# Patient Record
Sex: Male | Born: 1970 | Race: White | Hispanic: No | Marital: Married | Smoking: Never smoker
Health system: Southern US, Community
[De-identification: ages and names within clinical notes are randomized; demographics above are authoritative.]

## PROBLEM LIST (undated history)

## (undated) DIAGNOSIS — I10 Essential (primary) hypertension: Secondary | ICD-10-CM

## (undated) DIAGNOSIS — E559 Vitamin D deficiency, unspecified: Secondary | ICD-10-CM

## (undated) HISTORY — PX: APPENDECTOMY: SHX54

## (undated) HISTORY — PX: HERNIA REPAIR: SHX51

## (undated) HISTORY — DX: Vitamin D deficiency, unspecified: E55.9

---

## 2019-03-14 ENCOUNTER — Emergency Department (HOSPITAL_COMMUNITY): Payer: Medicaid Other

## 2019-03-14 ENCOUNTER — Encounter (HOSPITAL_COMMUNITY): Payer: Self-pay

## 2019-03-14 ENCOUNTER — Emergency Department (HOSPITAL_COMMUNITY)
Admission: EM | Admit: 2019-03-14 | Discharge: 2019-03-14 | Disposition: A | Discharge status: Home / Self Care | Payer: Medicaid Other | Attending: Emergency Medicine | Admitting: Emergency Medicine

## 2019-03-14 ENCOUNTER — Other Ambulatory Visit: Payer: Self-pay

## 2019-03-14 DIAGNOSIS — I10 Essential (primary) hypertension: Secondary | ICD-10-CM | POA: Diagnosis not present

## 2019-03-14 DIAGNOSIS — R42 Dizziness and giddiness: Secondary | ICD-10-CM | POA: Insufficient documentation

## 2019-03-14 HISTORY — DX: Essential (primary) hypertension: I10

## 2019-03-14 LAB — CBC WITH DIFFERENTIAL/PLATELET
Abs Immature Granulocytes: 0.02 10*3/uL (ref 0.00–0.07)
Basophils Absolute: 0 10*3/uL (ref 0.0–0.1)
Basophils Relative: 1 %
Eosinophils Absolute: 0.1 10*3/uL (ref 0.0–0.5)
Eosinophils Relative: 2 %
HCT: 46.2 % (ref 39.0–52.0)
Hemoglobin: 15.3 g/dL (ref 13.0–17.0)
Immature Granulocytes: 0 %
Lymphocytes Relative: 21 %
Lymphs Abs: 1 10*3/uL (ref 0.7–4.0)
MCH: 28.9 pg (ref 26.0–34.0)
MCHC: 33.1 g/dL (ref 30.0–36.0)
MCV: 87.2 fL (ref 80.0–100.0)
Monocytes Absolute: 0.4 10*3/uL (ref 0.1–1.0)
Monocytes Relative: 8 %
Neutro Abs: 3.2 10*3/uL (ref 1.7–7.7)
Neutrophils Relative %: 68 %
Platelets: 176 10*3/uL (ref 150–400)
RBC: 5.3 MIL/uL (ref 4.22–5.81)
RDW: 12.4 % (ref 11.5–15.5)
WBC: 4.7 10*3/uL (ref 4.0–10.5)
nRBC: 0 % (ref 0.0–0.2)

## 2019-03-14 LAB — BASIC METABOLIC PANEL
Anion gap: 9 (ref 5–15)
BUN: 12 mg/dL (ref 6–20)
CO2: 27 mmol/L (ref 22–32)
Calcium: 8.9 mg/dL (ref 8.9–10.3)
Chloride: 101 mmol/L (ref 98–111)
Creatinine, Ser: 0.79 mg/dL (ref 0.61–1.24)
GFR calc Af Amer: >60 mL/min (ref >60)
GFR calc non Af Amer: >60 mL/min (ref >60)
Glucose, Bld: 112 mg/dL — ABNORMAL HIGH (ref 70–99)
Potassium: 4.4 mmol/L (ref 3.5–5.1)
Sodium: 137 mmol/L (ref 135–145)

## 2019-03-14 LAB — URINALYSIS, ROUTINE W REFLEX MICROSCOPIC
Bilirubin Urine: NEGATIVE
Glucose, UA: NEGATIVE mg/dL
Hgb urine dipstick: NEGATIVE
Ketones, ur: NEGATIVE mg/dL
Leukocytes,Ua: NEGATIVE
Nitrite: NEGATIVE
Protein, ur: NEGATIVE mg/dL
Specific Gravity, Urine: 1.021 (ref 1.005–1.030)
pH: 7 (ref 5.0–8.0)

## 2019-03-14 LAB — TROPONIN I (HIGH SENSITIVITY)
Troponin I (High Sensitivity): <2 ng/L (ref <18)
Troponin I (High Sensitivity): <2 ng/L (ref <18)

## 2019-03-14 MED ORDER — MECLIZINE HCL 12.5 MG PO TABS
25.0000 mg | ORAL_TABLET | Freq: Once | ORAL | Status: AC
  Administered 2019-03-14: 25 mg via ORAL
  Filled 2019-03-14: qty 2

## 2019-03-14 MED ORDER — LISINOPRIL 10 MG PO TABS
10.0000 mg | ORAL_TABLET | Freq: Every day | ORAL | Status: DC
  Filled 2019-03-14: qty 1

## 2019-03-14 MED ORDER — LISINOPRIL 10 MG PO TABS: 10.0000 mg | ORAL_TABLET | Freq: Every day | ORAL | 0 refills | Status: DC

## 2019-03-14 MED ORDER — SODIUM CHLORIDE 0.9 % IV BOLUS
1000.0000 mL | Freq: Once | INTRAVENOUS | Status: AC
  Administered 2019-03-14: 1000 mL via INTRAVENOUS

## 2019-03-14 MED ORDER — MECLIZINE HCL 25 MG PO TABS: 25.0000 mg | ORAL_TABLET | Freq: Three times a day (TID) | ORAL | 0 refills | Status: DC | PRN

## 2019-03-14 NOTE — Discharge Instructions (Signed)
Your blood work and CT of your head today were normal.  I have written a prescription for you to start back on your blood pressure medications.  I have listed several local primary providers for you to contact to establish primary care.  Return to the emergency department for any worsening symptoms.

## 2019-03-14 NOTE — ED Triage Notes (Signed)
Pt reports dizziness and feeling off balance since 9:30 yesterday morning.  Denies any weakness or numbness on either side, denies difficulty speaking or swallowing, denies visual changes or headache.  Reports nauseated this morning, no vomiting.

## 2019-03-14 NOTE — ED Provider Notes (Signed)
[facility] Provider Note   CSN: [id number] Arrival date & time: 03/14/19  9242     History Chief Complaint  Patient presents with  . Dizziness    Paul [name] is a 49 y.o. male.  HPI      Paul [name] is a 49 y.o. male who presents to the Emergency Department complaining of persistent dizziness.  He noticed mild dizziness with position change that began yesterday morning.  This morning, he woke with same symptoms.  Describes feeling "off balance" and symptoms worsen with movement.  No alleviating factors.  Symptoms are not associated with chest pain, shortness of breath,  headache, visual changes, nausea or vomiting, pain numbness or weakness of the extremities or face.  He reports a history of hypertension, but has been out of his medication for some time since moving here from [place].  He does not currently have a PCP.    Past Medical History:  Diagnosis Date  . Hypertension     There are no problems to display for this patient.   Past Surgical History:  Procedure Laterality Date  . APPENDECTOMY    . HERNIA REPAIR         No family history on file.  Social History   Tobacco Use  . Smoking status: Never Smoker  . Smokeless tobacco: Never Used  Substance Use Topics  . Alcohol use: Yes    Comment: occ  . Drug use: Never    Home Medications Prior to Admission medications   Not on File    Allergies    Patient has no known allergies.  Review of Systems   Review of Systems  Constitutional: Negative for appetite change, chills and fever.  HENT: Negative for facial swelling and trouble swallowing.   Eyes: Negative for photophobia, pain and visual disturbance.  Respiratory: Negative for chest tightness and shortness of breath.   Cardiovascular: Negative for chest pain and leg swelling.  Gastrointestinal: Negative for abdominal pain, nausea and vomiting.  Genitourinary: Negative for decreased urine volume.  Musculoskeletal:  Negative for myalgias, neck pain and neck stiffness.  Skin: Negative for rash and wound.  Neurological: Positive for dizziness. Negative for seizures, syncope, facial asymmetry, speech difficulty, weakness, numbness and headaches.  Psychiatric/Behavioral: Negative for confusion and decreased concentration.    Physical Exam Updated Vital Signs BP (!) 156/100   Pulse 61   Temp 97.7 F (36.5 C) (Oral)   Resp 14   Ht 5\' 10"  (1.778 m)   Wt 124.7 kg   SpO2 98%   BMI 39.46 kg/m   Physical Exam Vitals and nursing note reviewed.  Constitutional:      General: He is not in acute distress.    Appearance: Normal appearance.  HENT:     Head: Atraumatic.     Right Ear: Tympanic membrane and ear canal normal.     Left Ear: Tympanic membrane and ear canal normal.     Mouth/Throat:     Mouth: Mucous membranes are moist.     Pharynx: Oropharynx is clear.  Eyes:     Extraocular Movements: Extraocular movements intact.     Conjunctiva/sclera: Conjunctivae normal.     Pupils: Pupils are equal, round, and reactive to light.  Cardiovascular:     Rate and Rhythm: Regular rhythm.     Pulses: Normal pulses.  Pulmonary:     Effort: Pulmonary effort is normal.     Breath sounds: Normal breath sounds.  Chest:     Chest wall:  No tenderness.  Abdominal:     General: There is no distension.     Palpations: Abdomen is soft.     Tenderness: There is no abdominal tenderness.  Musculoskeletal:        General: Normal range of motion.     Cervical back: Normal range of motion.     Right lower leg: No edema.     Left lower leg: No edema.  Lymphadenopathy:     Cervical: No cervical adenopathy.  Skin:    General: Skin is warm.     Capillary Refill: Capillary refill takes less than 2 seconds.     Findings: No erythema or rash.  Neurological:     General: No focal deficit present.     Mental Status: He is alert.     Sensory: Sensation is intact. No sensory deficit.     Motor: Motor function is  intact. No weakness.     Coordination: Coordination is intact. Romberg sign negative. Coordination normal.     Comments: CN II-XII intact.  Speech clear.  nml finger-nose testing.  No facial weakness.   Psychiatric:        Mood and Affect: Mood normal.     ED Results / Procedures / Treatments   Labs (all labs ordered are listed, but only abnormal results are displayed) Labs Reviewed  BASIC METABOLIC PANEL - Abnormal; Notable for the following components:      Result Value   Glucose, Bld 112 (*)    All other components within normal limits  CBC WITH DIFFERENTIAL/PLATELET  URINALYSIS, ROUTINE W REFLEX MICROSCOPIC  TROPONIN I (HIGH SENSITIVITY)  TROPONIN I (HIGH SENSITIVITY)    EKG EKG Interpretation  Date/Time:  Thursday March 14 2019 09:56:11 EST Ventricular Rate:  64 PR Interval:    QRS Duration: 120 QT Interval:  410 QTC Calculation: 423 R Axis:   28 Text Interpretation: Sinus rhythm Nonspecific intraventricular conduction delay Anteroseptal infarct, age indeterminate Lateral leads are also involved Confirmed by Donnetta Hutching ([id number]) on 03/14/2019 1:41:24 PM   Radiology CT Head Wo Contrast  Result Date: 03/14/2019 CLINICAL DATA:  Dizziness. Ataxia. Stroke suspected. EXAM: CT HEAD WITHOUT CONTRAST TECHNIQUE: Contiguous axial images were obtained from the base of the skull through the vertex without intravenous contrast. COMPARISON:  None. FINDINGS: Brain: No mass lesion, hemorrhage, hydrocephalus, acute infarct, intra-axial, or extra-axial fluid collection. Vascular: No hyperdense vessel or unexpected calcification. Skull: Normal. Sinuses/Orbits: Normal imaged portions of the orbits and globes. Ethmoid air cell mucosal thickening. Clear mastoid air cells. Other: None. IMPRESSION: 1. No acute intracranial abnormality. 2. Minimal sinus disease. Electronically Signed   By: Jeronimo Greaves M.D.   On: 03/14/2019 11:44   DG Chest Portable 1 View  Result Date: 03/14/2019 CLINICAL DATA:   Dizziness EXAM: PORTABLE CHEST 1 VIEW COMPARISON:  None. FINDINGS: The heart size is upper limits of normal for technique. No focal airspace consolidation, pleural effusion, or pneumothorax. The visualized skeletal structures are unremarkable. IMPRESSION: No active disease. Electronically Signed   By: Duanne Guess D.O.   On: 03/14/2019 10:41    Procedures Procedures (including critical care time)  Medications Ordered in ED Medications  sodium chloride 0.9 % bolus 1,000 mL (0 mLs Intravenous Stopped 03/14/19 1250)  meclizine (ANTIVERT) tablet 25 mg (25 mg Oral Given 03/14/19 1336)    ED Course  I have reviewed the triage vital signs and the nursing notes.  Pertinent labs & imaging results that were available during my care of the patient were  reviewed by me and considered in my medical decision making (see chart for details).    MDM Rules/Calculators/A&P                      Pt with dizziness associated with position change.  No associated symptoms. Hx of HTN and has been w/o medication for more than one year  Will obtain labs, CT head and EKG.  Pt also seen by Dr. Adriana Simas and care plan discussed.    Orthostatic vitals:  Sitting:  153/93  HR 69  Standing:  166/94  HR  83  On recheck, pt sleeping, easily aroused.  Feeling better.  BP now 120/85 while I was in the room.  Work up today is reassuring.  Pt ambulates with a steady gait.  No focal neuro deficits here.  I will refill his anti-hypertensive medication and give him referral info for local primary care.  Return precautions discussed.    Final Clinical Impression(s) / ED Diagnoses Final diagnoses:  Dizziness  Hypertension, unspecified type    Rx / DC Orders ED Discharge Orders    None       Pauline Aus, PA-C 03/15/19 0914    Donnetta Hutching, MD 03/15/19 [phone]

## 2019-05-06 ENCOUNTER — Encounter (INDEPENDENT_AMBULATORY_CARE_PROVIDER_SITE_OTHER): Payer: Self-pay | Admitting: Internal Medicine

## 2019-05-06 ENCOUNTER — Other Ambulatory Visit: Payer: Self-pay

## 2019-05-06 ENCOUNTER — Ambulatory Visit (INDEPENDENT_AMBULATORY_CARE_PROVIDER_SITE_OTHER): Payer: Medicaid Other | Admitting: Internal Medicine

## 2019-05-06 VITALS — BP 150/75 | HR 80 | Temp 97.7°F | Ht 70.0 in | Wt 289.8 lb

## 2019-05-06 DIAGNOSIS — R5383 Other fatigue: Secondary | ICD-10-CM | POA: Diagnosis not present

## 2019-05-06 DIAGNOSIS — I10 Essential (primary) hypertension: Secondary | ICD-10-CM

## 2019-05-06 DIAGNOSIS — Z125 Encounter for screening for malignant neoplasm of prostate: Secondary | ICD-10-CM | POA: Diagnosis not present

## 2019-05-06 DIAGNOSIS — E559 Vitamin D deficiency, unspecified: Secondary | ICD-10-CM | POA: Diagnosis not present

## 2019-05-06 DIAGNOSIS — R5381 Other malaise: Secondary | ICD-10-CM

## 2019-05-06 MED ORDER — LISINOPRIL 10 MG PO TABS: 10.0000 mg | ORAL_TABLET | Freq: Every day | ORAL | 3 refills | Status: DC

## 2019-05-06 NOTE — Progress Notes (Signed)
Metrics: Intervention Frequency ACO  Documented Smoking Status Yearly  Screened one or more times in 24 months  Cessation Counseling or  Active cessation medication Past 24 months  Past 24 months   Guideline developer: UpToDate (See UpToDate for funding source) Date Released: 2014       Wellness Office Visit  Subjective:  Patient ID: Paul [name], male    DOB: 01/15/1970  Age: 49 y.o. MRN: [id number]  CC: This 49 year old man comes to our practice as a new patient to be established.    HPI  He was seen in the emergency room in March of this year when he presented with dizziness and he was found to be significantly hypertensive.  He has known to have hypertension but it seems he has not been very compliant with medications in the past.  Once he started taking the medications, he did feel better. Presently he has run out of medications again.  He describes intermittent fatigue. He tells me that since he has moved down to [place] from [place], he has gained about 60 pounds. Past Medical History:  Diagnosis Date  . Hypertension       Family History  Problem Relation Age of Onset  . Healthy Mother   . Cancer Father   . Lung cancer Father     Social History   Social History Narrative   Married for 8 months,second.Lives with wife and daughter.Moved from [place] in 2018.   Social History   Tobacco Use  . Smoking status: Never Smoker  . Smokeless tobacco: Never Used  Substance Use Topics  . Alcohol use: Yes    Alcohol/week: 18.0 standard drinks    Types: 18 Cans of beer per week    Current Meds  Medication Sig  . lisinopril (ZESTRIL) 10 MG tablet Take 1 tablet (10 mg total) by mouth daily.  . [DISCONTINUED] lisinopril (ZESTRIL) 10 MG tablet Take 1 tablet (10 mg total) by mouth daily.      Objective:   Today's Vitals: BP (!) 150/75 (BP Location: Left Arm, Patient Position: Sitting, Cuff Size: Normal)   Pulse 80   Temp 97.7 F (36.5 C) (Temporal)   Ht 5'  10" (1.778 m)   Wt 289 lb 12.8 oz (131.5 kg)   SpO2 92%   BMI 41.58 kg/m  Vitals with BMI 05/06/2019 03/14/2019 03/14/2019  Height 5\' 10"  - -  Weight 289 lbs 13 oz - -  BMI 60.10 - -  Systolic 932 355 732  Diastolic 75 88 202  Pulse 80 68 61     Physical Exam  He looks systemically well.  Blood pressure not well controlled systolically.  Morbidly obese.  Alert and orientated without any focal neurological signs.     Assessment   1. Essential hypertension, benign   2. Morbid obesity ([place])   3. Malaise and fatigue   4. Vitamin D deficiency disease   5. Special screening for malignant neoplasm of prostate       Tests ordered Orders Placed This Encounter  Procedures  . COMPLETE METABOLIC PANEL WITH GFR  . Hemoglobin A1c  . Lipid panel  . PSA  . T3, free  . T4  . TSH  . VITAMIN D 25 Hydroxy (Vit-D Deficiency, Fractures)     Plan: 1. Blood work is ordered. 2. I have refilled his lisinopril. 3. Further recommendations will depend on blood results and I will see him in a few weeks to discuss all his results and further recommendations.  Meds ordered this encounter  Medications  . lisinopril (ZESTRIL) 10 MG tablet    Sig: Take 1 tablet (10 mg total) by mouth daily.    Dispense:  30 tablet    Refill:  3    Leylah Tarnow Normajean Glasgow, MD

## 2019-05-07 LAB — COMPLETE METABOLIC PANEL WITH GFR
AG Ratio: 1.7 (calc) (ref 1.0–2.5)
ALT: 34 U/L (ref 9–46)
AST: 19 U/L (ref 10–35)
Albumin: 4.7 g/dL (ref 3.6–5.1)
Alkaline phosphatase (APISO): 96 U/L (ref 35–144)
BUN: 17 mg/dL (ref 7–25)
CO2: 29 mmol/L (ref 20–32)
Calcium: 9.6 mg/dL (ref 8.6–10.3)
Chloride: 101 mmol/L (ref 98–110)
Creat: 0.92 mg/dL (ref 0.70–1.33)
GFR, Est African American: 112 mL/min/{1.73_m2} (ref >=60)
GFR, Est Non African American: 97 mL/min/{1.73_m2} (ref >=60)
Globulin: 2.8 g/dL (calc) (ref 1.9–3.7)
Glucose, Bld: 83 mg/dL (ref 65–99)
Potassium: 4.3 mmol/L (ref 3.5–5.3)
Sodium: 139 mmol/L (ref 135–146)
Total Bilirubin: 0.6 mg/dL (ref 0.2–1.2)
Total Protein: 7.5 g/dL (ref 6.1–8.1)

## 2019-05-07 LAB — VITAMIN D 25 HYDROXY (VIT D DEFICIENCY, FRACTURES): Vit D, 25-Hydroxy: 15 ng/mL — ABNORMAL LOW (ref 30–100)

## 2019-05-07 LAB — HEMOGLOBIN A1C
Hgb A1c MFr Bld: 5.2 % of total Hgb (ref <5.7)
Mean Plasma Glucose: 103 (calc)
eAG (mmol/L): 5.7 (calc)

## 2019-05-07 LAB — PSA: PSA: 1.2 ng/mL (ref <=4.0)

## 2019-05-07 LAB — TSH: TSH: 1.53 mIU/L (ref 0.40–4.50)

## 2019-05-07 LAB — LIPID PANEL
Cholesterol: 187 mg/dL (ref <200)
HDL: 38 mg/dL — ABNORMAL LOW (ref >=40)
LDL Cholesterol (Calc): 111 mg/dL (calc) — ABNORMAL HIGH
Non-HDL Cholesterol (Calc): 149 mg/dL (calc) — ABNORMAL HIGH (ref <130)
Total CHOL/HDL Ratio: 4.9 (calc) (ref <5.0)
Triglycerides: 263 mg/dL — ABNORMAL HIGH (ref <150)

## 2019-05-07 LAB — T4: T4, Total: 7.6 ug/dL (ref 4.9–10.5)

## 2019-05-07 LAB — T3, FREE: T3, Free: 3.4 pg/mL (ref 2.3–4.2)

## 2019-06-06 ENCOUNTER — Ambulatory Visit (INDEPENDENT_AMBULATORY_CARE_PROVIDER_SITE_OTHER): Payer: Medicaid Other | Admitting: Internal Medicine

## 2019-07-31 ENCOUNTER — Encounter (INDEPENDENT_AMBULATORY_CARE_PROVIDER_SITE_OTHER): Payer: Self-pay | Admitting: Nurse Practitioner

## 2019-07-31 DIAGNOSIS — I10 Essential (primary) hypertension: Secondary | ICD-10-CM | POA: Insufficient documentation

## 2019-07-31 DIAGNOSIS — E559 Vitamin D deficiency, unspecified: Secondary | ICD-10-CM | POA: Insufficient documentation

## 2019-07-31 DIAGNOSIS — E785 Hyperlipidemia, unspecified: Secondary | ICD-10-CM | POA: Insufficient documentation

## 2019-08-01 ENCOUNTER — Telehealth (INDEPENDENT_AMBULATORY_CARE_PROVIDER_SITE_OTHER): Payer: Self-pay | Admitting: Nurse Practitioner

## 2019-08-01 ENCOUNTER — Ambulatory Visit (INDEPENDENT_AMBULATORY_CARE_PROVIDER_SITE_OTHER): Payer: Medicaid Other | Admitting: Nurse Practitioner

## 2019-08-01 ENCOUNTER — Encounter (INDEPENDENT_AMBULATORY_CARE_PROVIDER_SITE_OTHER): Payer: Self-pay | Admitting: Nurse Practitioner

## 2019-08-01 ENCOUNTER — Other Ambulatory Visit: Payer: Self-pay

## 2019-08-01 VITALS — BP 150/60 | HR 85 | Resp 16 | Ht 71.0 in | Wt 287.4 lb

## 2019-08-01 DIAGNOSIS — E785 Hyperlipidemia, unspecified: Secondary | ICD-10-CM

## 2019-08-01 DIAGNOSIS — I10 Essential (primary) hypertension: Secondary | ICD-10-CM | POA: Diagnosis not present

## 2019-08-01 DIAGNOSIS — E559 Vitamin D deficiency, unspecified: Secondary | ICD-10-CM | POA: Diagnosis not present

## 2019-08-01 DIAGNOSIS — N63 Unspecified lump in unspecified breast: Secondary | ICD-10-CM | POA: Insufficient documentation

## 2019-08-01 MED ORDER — LISINOPRIL-HYDROCHLOROTHIAZIDE 10-12.5 MG PO TABS: 1.0000 | ORAL_TABLET | Freq: Every day | ORAL | 3 refills | Status: DC

## 2019-08-01 NOTE — Telephone Encounter (Signed)
I ordered diagnostic mammogram and ultrasound on this patient today for further evaluation of a mass noted to his left breast. This was ordered at his office visit earlier today, please make sure that this gets scheduled. Thank you.

## 2019-08-01 NOTE — Patient Instructions (Addendum)
Start vitamin D3 - 5000Ius by mouth daily  [place] [facility] Dietary Recommendations for Weight Loss What to Avoid . Avoid added sugars o Often added sugar can be found in processed foods such as many condiments, dry cereals, cakes, cookies, chips, crisps, crackers, candies, sweetened drinks, etc.  o Read labels and AVOID/DECREASE use of foods with the following in their ingredient list: Sugar, fructose, high fructose corn syrup, sucrose, glucose, maltose, dextrose, molasses, cane sugar, brown sugar, any type of syrup, agave nectar, etc.   . Avoid snacking in between meals . Avoid foods made with flour o If you are going to eat food made with flour, choose those made with whole-grains; and, minimize your consumption as much as is tolerable . Avoid processed foods o These foods are generally stocked in the middle of the grocery store. Focus on shopping on the perimeter of the grocery.  . Avoid Meat  o We recommend following a plant-based diet at [facility]. Thus, we recommend avoiding meat as a general rule. Consider eating beans, legumes, eggs, and/or dairy products for regular protein sources o If you plan on eating meat limit to 4 ounces of meat at a time and choose lean options such as Fish, chicken, [place]. Avoid red meat intake such as pork and/or steak What to Include . Vegetables o GREEN LEAFY VEGETABLES: Kale, spinach, mustard greens, collard greens, cabbage, broccoli, etc. o OTHER: Asparagus, cauliflower, eggplant, carrots, peas, Brussel sprouts, tomatoes, bell peppers, zucchini, beets, cucumbers, etc. . Grains, seeds, and legumes o Beans: kidney beans, black eyed peas, garbanzo beans, black beans, pinto beans, etc. o Whole, unrefined grains: brown rice, barley, bulgur, oatmeal, etc. . Healthy fats  o Avoid highly processed fats such as vegetable oil o Examples of healthy fats: avocado, olives, virgin olive oil, dark chocolate (?72% Cocoa), nuts (peanuts, almonds,  walnuts, cashews, pecans, etc.) . None to Low Intake of Animal Sources of Protein o Meat sources: chicken, [place], salmon, tuna. Limit to 4 ounces of meat at one time. o Consider limiting dairy sources, but when choosing dairy focus on: PLAIN [place] yogurt, cottage cheese, high-protein milk . Fruit o Choose berries  When to Eat . Intermittent Fasting: o Choosing not to eat for a specific time period, but DO FOCUS ON HYDRATION when fasting o Multiple Techniques: - Time Restricted Eating: eat 3 meals in a day, each meal lasting no more than 60 minutes, no snacks between meals - 16-18 hour fast: fast for 16 to 18 hours up to 7 days a week. Often suggested to start with 2-3 nonconsecutive days per week.  . Remember the time you sleep is counted as fasting.  . Examples of eating schedule: Fast from 7:00pm-11:00am. Eat between 11:00am-7:00pm.  - 24-hour fast: fast for 24 hours up to every other day. Often suggested to start with 1 day per week . Remember the time you sleep is counted as fasting . Examples of eating schedule:  o Eating day: eat 2-3 meals on your eating day. If doing 2 meals, each meal should last no more than 90 minutes. If doing 3 meals, each meal should last no more than 60 minutes. Finish last meal by 7:00pm. o Fasting day: Fast until 7:00pm.  o IF YOU FEEL UNWELL FOR ANY REASON/IN ANY WAY WHEN FASTING, STOP FASTING BY EATING A NUTRITIOUS SNACK OR LIGHT MEAL o ALWAYS FOCUS ON HYDRATION DURING FASTS - Acceptable Hydration sources: water, broths, tea/coffee (black tea/coffee is best but using a small amount of  whole-fat dairy products in coffee/tea is acceptable).  - Poor Hydration Sources: anything with sugar or artificial sweeteners added to it  These recommendations have been developed for patients that are actively receiving medical care from either Dr. Anastasio Champion or Jeralyn Ruths, DNP, NP-C at [facility]. These recommendations are developed for patients with specific  medical conditions and are not meant to be distributed or used by others that are not actively receiving care from either provider listed above at [facility]. It is not appropriate to participate in the above eating plans without proper medical supervision.   Reference: Rexanne Mano. The obesity code. [place]; 2016.

## 2019-08-01 NOTE — Progress Notes (Signed)
Subjective:  Patient ID: Paul [name], male    DOB: 1970/08/15  Age: 49 y.o. MRN: [id number]  CC:  Chief Complaint  Patient presents with  . Hypertension    follow up      HPI  This patient comes in today for follow-up of hypertension.  Hypertension: He continues on lisinopril 10 mg daily. He is tolerating medication well. He tells me he will have some episodes of dizziness which she was evaluated for earlier this year in emergency department.  Lab work was collected as last office visit which did show hyperlipidemia and vitamin D deficiency.  He also mentions to me that he noticed a mass to his left breast approximately 1 week ago. He tells me he feels like it has moved, size is remained stable. He denies any nipple discharge he denies any pain.   Past Medical History:  Diagnosis Date  . Hypertension   . Vitamin D deficiency       Family History  Problem Relation Age of Onset  . Healthy Mother   . Cancer Father   . Lung cancer Father     Social History   Social History Narrative   Married for 8 months,second.Lives with wife and daughter.Moved from [place] in 2018.   Social History   Tobacco Use  . Smoking status: Never Smoker  . Smokeless tobacco: Never Used  Substance Use Topics  . Alcohol use: Yes    Alcohol/week: 18.0 standard drinks    Types: 18 Cans of beer per week     Current Meds  Medication Sig  . Cholecalciferol 1.25 MG (50000 UT) TABS Take 5,000 tablets by mouth daily.  . [DISCONTINUED] lisinopril (ZESTRIL) 10 MG tablet Take 1 tablet (10 mg total) by mouth daily.    ROS:  Review of Systems  Constitutional: Negative.   Respiratory: Negative.   Cardiovascular: Negative.   Neurological: Positive for dizziness.     Objective:   Today's Vitals: BP (!) 150/60   Pulse 85   Resp 16   Ht 5\' 11"  (1.803 m)   Wt (!) 287 lb 6.4 oz (130.4 kg)   SpO2 97%   BMI 40.08 kg/m  Vitals with BMI 08/01/2019 05/06/2019 03/14/2019  Height 5\' 11"   5\' 10"  -  Weight 287 lbs 6 oz 289 lbs 13 oz -  BMI 40.1 41.58 -  Systolic 150 150 05/14/2019  Diastolic 60 75 88  Pulse 85 80 68     Physical Exam Vitals reviewed.  Constitutional:      Appearance: Normal appearance.  HENT:     Head: Normocephalic and atraumatic.  Cardiovascular:     Rate and Rhythm: Normal rate and regular rhythm.  Pulmonary:     Effort: Pulmonary effort is normal.     Breath sounds: Normal breath sounds.  Chest:     Breasts: Breasts are symmetrical.        Left: Mass present. No swelling, inverted nipple, nipple discharge, skin change or tenderness.    Musculoskeletal:     Cervical back: Neck supple.  Skin:    General: Skin is warm and dry.  Neurological:     Mental Status: He is alert and oriented to person, place, and time.  Psychiatric:        Mood and Affect: Mood normal.        Behavior: Behavior normal.        Thought Content: Thought content normal.        Judgment: Judgment normal.  Assessment and Plan   1. Essential hypertension, benign   2. Breast mass in male   3. Hyperlipidemia, unspecified hyperlipidemia type   4. Vitamin D deficiency      Plan: 1. Blood pressure remains elevated. Will add hydrochlorothiazide to his medication regimen, he will return in 2 weeks for blood pressure check and repeat CMP. 2. I am going to send him for mammogram and ultrasound for further evaluation of the mass in his breast. 3. ASCVD risk score is approximately 6.5. We did discuss lifestyle and I encouraged him to follow a plant focused diet. I told him to avoid processed carbohydrates, red meat, and processed meats. I told him to focus on eating whole foods and legumes. He tells me he understands. 4. He was told to start 5000 IUs of vitamin D3 daily.   Tests ordered Orders Placed This Encounter  Procedures  . MM Digital Diagnostic Unilat L  . US BREAST COMPLETE UNI LEFT INC AXILLA      Meds ordered this encounter  Medications  .  lisinopril-hydrochlorothiazide (ZESTORETIC) 10-12.5 MG tablet    Sig: Take 1 tablet by mouth daily.    Dispense:  90 tablet    Refill:  3    Order Specific Question:   Supervising Provider    Answer:   Wilson Singer [[id number]]    Patient to follow-up in 2 weeks for blood pressure check and repeat metabolic panel.  Elenore Paddy, NP

## 2019-08-05 ENCOUNTER — Other Ambulatory Visit (HOSPITAL_COMMUNITY): Payer: Self-pay | Admitting: General Practice

## 2019-08-05 ENCOUNTER — Other Ambulatory Visit (HOSPITAL_COMMUNITY): Payer: Self-pay | Admitting: Nurse Practitioner

## 2019-08-05 DIAGNOSIS — N63 Unspecified lump in unspecified breast: Secondary | ICD-10-CM

## 2019-08-05 NOTE — Telephone Encounter (Signed)
[place] is scheduled on 08/13/19 at 3:00

## 2019-08-13 ENCOUNTER — Ambulatory Visit (HOSPITAL_COMMUNITY)
Admission: RE | Admit: 2019-08-13 | Discharge: 2019-08-13 | Disposition: A | Discharge status: Home / Self Care | Payer: Medicaid Other | Source: Ambulatory Visit | Attending: Nurse Practitioner | Admitting: Nurse Practitioner

## 2019-08-13 ENCOUNTER — Other Ambulatory Visit: Payer: Self-pay

## 2019-08-13 DIAGNOSIS — N63 Unspecified lump in unspecified breast: Secondary | ICD-10-CM

## 2019-08-13 DIAGNOSIS — R928 Other abnormal and inconclusive findings on diagnostic imaging of breast: Secondary | ICD-10-CM | POA: Diagnosis not present

## 2019-08-15 ENCOUNTER — Ambulatory Visit (INDEPENDENT_AMBULATORY_CARE_PROVIDER_SITE_OTHER): Payer: Medicaid Other | Admitting: Nurse Practitioner

## 2019-08-15 ENCOUNTER — Other Ambulatory Visit: Payer: Self-pay

## 2019-08-15 ENCOUNTER — Encounter (INDEPENDENT_AMBULATORY_CARE_PROVIDER_SITE_OTHER): Payer: Self-pay | Admitting: Nurse Practitioner

## 2019-08-15 VITALS — BP 125/80 | HR 74 | Temp 97.5°F | Ht 71.0 in | Wt 285.4 lb

## 2019-08-15 DIAGNOSIS — N62 Hypertrophy of breast: Secondary | ICD-10-CM

## 2019-08-15 DIAGNOSIS — N529 Male erectile dysfunction, unspecified: Secondary | ICD-10-CM | POA: Diagnosis not present

## 2019-08-15 DIAGNOSIS — E559 Vitamin D deficiency, unspecified: Secondary | ICD-10-CM | POA: Diagnosis not present

## 2019-08-15 DIAGNOSIS — I10 Essential (primary) hypertension: Secondary | ICD-10-CM | POA: Diagnosis not present

## 2019-08-15 NOTE — Progress Notes (Signed)
Subjective:  Patient ID: [name], male    DOB: 1970/12/24  Age: 49 y.o. MRN: [id number]  CC:  Chief Complaint  Patient presents with  . Hypertension  . Labs Only  . Follow-up      HPI  This patient comes in today for the above.  At last office visit we added hydrochlorothiazide to his medication regimen in addition to his lisinopril due to elevated high blood pressure.  He tells me has been feeling pretty well with this medication change, however he has noticed some difficulty with achieving and maintaining an erection.  He tells me he has noticed the symptoms intermittently prior to starting on antihypertensives, but the symptoms seem to be more pronounced since getting his blood pressure better controlled.  In addition, he did note a breast mass to his left breast at his last office visit.  He did undergo mammogram and no evidence of malignancy was noted.  Imaging report indicated mass is probably gynecomastia.  He continues to take his vitamin D3 supplement as recommended.  He is currently on 5000 IUs of vitamin D3 daily.  Past Medical History:  Diagnosis Date  . Hypertension   . Vitamin D deficiency       Family History  Problem Relation Age of Onset  . Healthy Mother   . Cancer Father   . Lung cancer Father     Social History   Social History Narrative   Married for 8 months,second.Lives with wife and daughter.Moved from [place] in 2018.   Social History   Tobacco Use  . Smoking status: Never Smoker  . Smokeless tobacco: Never Used  Substance Use Topics  . Alcohol use: Yes    Alcohol/week: 18.0 standard drinks    Types: 18 Cans of beer per week     Current Meds  Medication Sig  . Cholecalciferol 1.25 MG (50000 UT) TABS Take 5,000 tablets by mouth daily.  Marland Kitchen lisinopril-hydrochlorothiazide (ZESTORETIC) 10-12.5 MG tablet Take 1 tablet by mouth daily.    ROS:  Review of Systems  Constitutional: Negative.   Respiratory: Negative.     Cardiovascular: Negative.   Neurological: Negative.      Objective:   Today's Vitals: BP 125/80 (BP Location: Left Arm, Patient Position: Sitting, Cuff Size: Normal)   Pulse 74   Temp (!) 97.5 F (36.4 C)   Ht '5\' 11"'$  (1.803 m)   Wt 285 lb 6.4 oz (129.5 kg)   SpO2 95%   BMI 39.81 kg/m  Vitals with BMI 08/15/2019 08/01/2019 05/06/2019  Height '5\' 11"'$  '5\' 11"'$  '5\' 10"'$   Weight 285 lbs 6 oz 287 lbs 6 oz 289 lbs 13 oz  BMI 39.82 61.6 07.37  Systolic 106 269 485  Diastolic 80 60 75  Pulse 74 85 80     Physical Exam Vitals reviewed.  Constitutional:      Appearance: Normal appearance.  HENT:     Head: Normocephalic and atraumatic.  Cardiovascular:     Rate and Rhythm: Normal rate and regular rhythm.  Pulmonary:     Effort: Pulmonary effort is normal.     Breath sounds: Normal breath sounds.  Musculoskeletal:     Cervical back: Neck supple.  Skin:    General: Skin is warm and dry.  Neurological:     Mental Status: He is alert and oriented to person, place, and time.  Psychiatric:        Mood and Affect: Mood normal.  Behavior: Behavior normal.        Thought Content: Thought content normal.        Judgment: Judgment normal.          Assessment and Plan   1. Essential hypertension, benign   2. Gynecomastia   3. Vitamin D deficiency   4. Erectile dysfunction, unspecified erectile dysfunction type      Plan: 1.-2., 4.  He will continue on his current medications as prescribed.  He will return to the office early next week for an early morning blood draw at which point we will collect testosterone levels and CMP for further evaluation of his gynecomastia as well as CAD. 3.  I will have him follow-up in approximately 6 months at which point I will collect vitamin D3 serum level for further evaluation.  Tests ordered Orders Placed This Encounter  Procedures  . CMP with eGFR([facility])  . Testosterone Total,Free,Bio, Males      No orders of the defined types  were placed in this encounter.   Patient to follow-up for blood work next week, in 6 weeks, and then have office visit again in 3 months, unless blood work results show that he needs to be seen sooner.  Ailene Ards, NP

## 2019-08-19 ENCOUNTER — Other Ambulatory Visit: Payer: Self-pay

## 2019-08-19 ENCOUNTER — Other Ambulatory Visit (INDEPENDENT_AMBULATORY_CARE_PROVIDER_SITE_OTHER): Payer: Medicaid Other

## 2019-08-19 DIAGNOSIS — I10 Essential (primary) hypertension: Secondary | ICD-10-CM | POA: Diagnosis not present

## 2019-08-19 DIAGNOSIS — N62 Hypertrophy of breast: Secondary | ICD-10-CM | POA: Diagnosis not present

## 2019-08-20 LAB — TESTOSTERONE TOTAL,FREE,BIO, MALES
Albumin: 4.5 g/dL (ref 3.6–5.1)
Sex Hormone Binding: 17 nmol/L (ref 10–50)
Testosterone: 162 ng/dL — ABNORMAL LOW (ref 250–827)

## 2019-08-20 LAB — COMPLETE METABOLIC PANEL WITH GFR
AG Ratio: 1.7 (calc) (ref 1.0–2.5)
ALT: 34 U/L (ref 9–46)
AST: 19 U/L (ref 10–35)
Albumin: 4.5 g/dL (ref 3.6–5.1)
Alkaline phosphatase (APISO): 79 U/L (ref 35–144)
BUN: 14 mg/dL (ref 7–25)
CO2: 28 mmol/L (ref 20–32)
Calcium: 9.2 mg/dL (ref 8.6–10.3)
Chloride: 100 mmol/L (ref 98–110)
Creat: 0.95 mg/dL (ref 0.70–1.33)
GFR, Est African American: 108 mL/min/{1.73_m2} (ref >=60)
GFR, Est Non African American: 93 mL/min/{1.73_m2} (ref >=60)
Globulin: 2.6 g/dL (calc) (ref 1.9–3.7)
Glucose, Bld: 131 mg/dL — ABNORMAL HIGH (ref 65–99)
Potassium: 5 mmol/L (ref 3.5–5.3)
Sodium: 137 mmol/L (ref 135–146)
Total Bilirubin: 0.6 mg/dL (ref 0.2–1.2)
Total Protein: 7.1 g/dL (ref 6.1–8.1)

## 2019-09-11 ENCOUNTER — Other Ambulatory Visit (INDEPENDENT_AMBULATORY_CARE_PROVIDER_SITE_OTHER): Payer: Self-pay | Admitting: Nurse Practitioner

## 2019-09-11 DIAGNOSIS — E559 Vitamin D deficiency, unspecified: Secondary | ICD-10-CM

## 2019-09-30 ENCOUNTER — Other Ambulatory Visit (INDEPENDENT_AMBULATORY_CARE_PROVIDER_SITE_OTHER): Payer: Medicaid Other

## 2019-09-30 ENCOUNTER — Other Ambulatory Visit: Payer: Self-pay

## 2019-09-30 DIAGNOSIS — E559 Vitamin D deficiency, unspecified: Secondary | ICD-10-CM | POA: Diagnosis not present

## 2019-10-01 LAB — COMPLETE METABOLIC PANEL WITH GFR
AG Ratio: 1.6 (calc) (ref 1.0–2.5)
ALT: 35 U/L (ref 9–46)
AST: 21 U/L (ref 10–35)
Albumin: 4.5 g/dL (ref 3.6–5.1)
Alkaline phosphatase (APISO): 81 U/L (ref 35–144)
BUN: 14 mg/dL (ref 7–25)
CO2: 29 mmol/L (ref 20–32)
Calcium: 9.4 mg/dL (ref 8.6–10.3)
Chloride: 100 mmol/L (ref 98–110)
Creat: 0.89 mg/dL (ref 0.70–1.33)
GFR, Est African American: 116 mL/min/{1.73_m2} (ref >=60)
GFR, Est Non African American: 100 mL/min/{1.73_m2} (ref >=60)
Globulin: 2.8 g/dL (calc) (ref 1.9–3.7)
Glucose, Bld: 95 mg/dL (ref 65–99)
Potassium: 4.7 mmol/L (ref 3.5–5.3)
Sodium: 138 mmol/L (ref 135–146)
Total Bilirubin: 0.7 mg/dL (ref 0.2–1.2)
Total Protein: 7.3 g/dL (ref 6.1–8.1)

## 2019-10-01 LAB — VITAMIN D 25 HYDROXY (VIT D DEFICIENCY, FRACTURES): Vit D, 25-Hydroxy: 36 ng/mL (ref 30–100)

## 2019-10-03 ENCOUNTER — Other Ambulatory Visit: Payer: Self-pay

## 2019-10-03 ENCOUNTER — Ambulatory Visit (INDEPENDENT_AMBULATORY_CARE_PROVIDER_SITE_OTHER): Payer: Medicaid Other | Admitting: Internal Medicine

## 2019-10-03 ENCOUNTER — Encounter (INDEPENDENT_AMBULATORY_CARE_PROVIDER_SITE_OTHER): Payer: Self-pay | Admitting: Internal Medicine

## 2019-10-03 VITALS — BP 136/80 | HR 78 | Temp 97.2°F | Resp 18 | Ht 71.0 in | Wt 290.0 lb

## 2019-10-03 DIAGNOSIS — E291 Testicular hypofunction: Secondary | ICD-10-CM | POA: Diagnosis not present

## 2019-10-03 DIAGNOSIS — N529 Male erectile dysfunction, unspecified: Secondary | ICD-10-CM

## 2019-10-03 NOTE — Progress Notes (Signed)
Metrics: Intervention Frequency ACO  Documented Smoking Status Yearly  Screened one or more times in 24 months  Cessation Counseling or  Active cessation medication Past 24 months  Past 24 months   Guideline developer: UpToDate (See UpToDate for funding source) Date Released: 2014       Wellness Office Visit  Subjective:  Patient ID: [name], male    DOB: 12/23/1970  Age: 49 y.o. MRN: [id number]  CC: This man comes in for follow-up of low testosterone levels. HPI  Maralyn Sago had seen him and check testosterone levels on the last visit.  He describes erectile dysfunction.  He also has subjective gynecomastia.  Imaging did not reveal anything sinister/malignant.  Past Medical History:  Diagnosis Date  . Hypertension   . Vitamin D deficiency    Past Surgical History:  Procedure Laterality Date  . APPENDECTOMY    . HERNIA REPAIR       Family History  Problem Relation Age of Onset  . Healthy Mother   . Cancer Father   . Lung cancer Father     Social History   Social History Narrative   Married for 8 months,second.Lives with wife and daughter.Moved from [place] in 2018.   Social History   Tobacco Use  . Smoking status: Never Smoker  . Smokeless tobacco: Never Used  Substance Use Topics  . Alcohol use: Yes    Alcohol/week: 18.0 standard drinks    Types: 18 Cans of beer per week    Current Meds  Medication Sig  . Cholecalciferol 1.25 MG (50000 UT) TABS Take 5,000 tablets by mouth daily.  Marland Kitchen lisinopril-hydrochlorothiazide (ZESTORETIC) 10-12.5 MG tablet Take 1 tablet by mouth daily.      Depression screen [facility] 2/9 08/01/2019 05/06/2019  Decreased Interest 0 0  Down, Depressed, Hopeless 0 0  PHQ - 2 Score 0 0     Objective:   Today's Vitals: BP 136/80 (BP Location: Left Arm, Patient Position: Sitting, Cuff Size: Normal)   Pulse 78   Temp (!) 97.2 F (36.2 C) (Temporal)   Resp 18   Ht 5\' 11"  (1.803 m)   Wt 290 lb (131.5 kg)   SpO2 98%   BMI 40.45 kg/m    Vitals with BMI 10/03/2019 08/15/2019 08/01/2019  Height 5\' 11"  5\' 11"  5\' 11"   Weight 290 lbs 285 lbs 6 oz 287 lbs 6 oz  BMI 40.46 39.82 40.1  Systolic 136 125 08/03/2019  Diastolic 80 80 60  Pulse 78 74 85     Physical Exam  He looks systemically well.  He is morbidly obese.  Blood pressure systolically elevated.     Assessment   1. Erectile dysfunction, unspecified erectile dysfunction type   2. Morbid obesity (HCC)   3. Testicular failure       Tests ordered Orders Placed This Encounter  Procedures  . Prolactin     Plan: 1. His total testosterone is extremely low and I am going to check a prolactin level to make sure that there is no chemical evidence of the possibility of a pituitary tumor.  If the prolactin is normal, I will bring him back to discuss testosterone therapy which I think he will benefit from.   No orders of the defined types were placed in this encounter.   , MD

## 2019-10-04 ENCOUNTER — Encounter (INDEPENDENT_AMBULATORY_CARE_PROVIDER_SITE_OTHER): Payer: Self-pay | Admitting: Nurse Practitioner

## 2019-10-04 LAB — PROLACTIN: Prolactin: 6.7 ng/mL (ref 2.0–18.0)

## 2019-10-22 ENCOUNTER — Encounter (INDEPENDENT_AMBULATORY_CARE_PROVIDER_SITE_OTHER): Payer: Self-pay | Admitting: Internal Medicine

## 2019-10-22 ENCOUNTER — Other Ambulatory Visit: Payer: Self-pay

## 2019-10-22 ENCOUNTER — Ambulatory Visit (INDEPENDENT_AMBULATORY_CARE_PROVIDER_SITE_OTHER): Payer: Medicaid Other | Admitting: Internal Medicine

## 2019-10-22 VITALS — BP 128/74 | HR 72 | Temp 97.7°F | Ht 71.0 in | Wt 291.6 lb

## 2019-10-22 DIAGNOSIS — E291 Testicular hypofunction: Secondary | ICD-10-CM | POA: Diagnosis not present

## 2019-10-22 MED ORDER — TESTOSTERONE 20 % CREA: 100.0000 mg | TOPICAL_CREAM | Freq: Every day | 0 refills | Status: DC

## 2019-10-22 NOTE — Progress Notes (Signed)
Metrics: Intervention Frequency ACO  Documented Smoking Status Yearly  Screened one or more times in 24 months  Cessation Counseling or  Active cessation medication Past 24 months  Past 24 months   Guideline developer: UpToDate (See UpToDate for funding source) Date Released: 2014       Wellness Office Visit  Subjective:  Patient ID: [name], male    DOB: 07/30/1970  Age: 49 y.o. MRN: [id number]  CC: This man comes in to discuss testosterone therapy. HPI  His total testosterone level was very low when it was checked and he clearly has symptoms of poor energy levels, erectile dysfunction, decreased sex drive, difficulty in focus and concentration.  I did check a prolactin level which was normal which eliminates the possibility of a pituitary tumor most likely.  Past Medical History:  Diagnosis Date  . Hypertension   . Vitamin D deficiency    Past Surgical History:  Procedure Laterality Date  . APPENDECTOMY    . HERNIA REPAIR       Family History  Problem Relation Age of Onset  . Healthy Mother   . Cancer Father   . Lung cancer Father     Social History   Social History Narrative   Married for 8 months,second.Lives with wife and daughter.Moved from [place] in 2018.   Social History   Tobacco Use  . Smoking status: Never Smoker  . Smokeless tobacco: Never Used  Substance Use Topics  . Alcohol use: Yes    Alcohol/week: 18.0 standard drinks    Types: 18 Cans of beer per week    Current Meds  Medication Sig  . Cholecalciferol 1.25 MG (50000 UT) TABS Take 5,000 tablets by mouth daily.  Marland Kitchen lisinopril-hydrochlorothiazide (ZESTORETIC) 10-12.5 MG tablet Take 1 tablet by mouth daily.      Depression screen [facility] 2/9 08/01/2019 05/06/2019  Decreased Interest 0 0  Down, Depressed, Hopeless 0 0  PHQ - 2 Score 0 0     Objective:   Today's Vitals: BP 128/74   Pulse 72   Temp 97.7 F (36.5 C) (Temporal)   Ht 5\' 11"  (1.803 m)   Wt 291 lb 9.6 oz (132.3 kg)    SpO2 97%   BMI 40.67 kg/m  Vitals with BMI 10/22/2019 10/03/2019 08/15/2019  Height 5\' 11"  5\' 11"  5\' 11"   Weight 291 lbs 10 oz 290 lbs 285 lbs 6 oz  BMI 40.69 40.46 39.82  Systolic 128 136 10/15/2019  Diastolic 74 80 80  Pulse 72 78 74     Physical Exam   He remains morbidly obese.  Blood pressure is well controlled.    Assessment   1. Testicular failure   2. Morbid obesity (HCC)       Tests ordered No orders of the defined types were placed in this encounter.    Plan: 1. Today we discussed testosterone therapy in detail.  I discussed the FDA warnings, benefits and side effects and mode of administration.  He would like to proceed with testosterone cream so I have called this into apothecary and we will start him at a dose of testosterone cream 100 mg applied to the scrotal skin daily. 2. I will see him in about 6 weeks time to see how he is doing and we will likely check testosterone levels then.   Meds ordered this encounter  Medications  . Testosterone 20 % CREA    Sig: Apply 100 mg topically daily.    Dispense:  100 g  Refill:  0    Yehudit Fulginiti Luther Parody, MD

## 2019-11-18 ENCOUNTER — Ambulatory Visit (INDEPENDENT_AMBULATORY_CARE_PROVIDER_SITE_OTHER): Payer: Medicaid Other | Admitting: Nurse Practitioner

## 2019-12-17 ENCOUNTER — Ambulatory Visit (INDEPENDENT_AMBULATORY_CARE_PROVIDER_SITE_OTHER): Payer: Medicaid Other | Admitting: Internal Medicine

## 2019-12-17 ENCOUNTER — Encounter (INDEPENDENT_AMBULATORY_CARE_PROVIDER_SITE_OTHER): Payer: Self-pay | Admitting: Internal Medicine

## 2019-12-17 ENCOUNTER — Other Ambulatory Visit: Payer: Self-pay

## 2019-12-17 VITALS — BP 133/90 | HR 88 | Temp 97.5°F | Resp 18 | Ht 71.0 in | Wt 303.2 lb

## 2019-12-17 DIAGNOSIS — M5441 Lumbago with sciatica, right side: Secondary | ICD-10-CM | POA: Diagnosis not present

## 2019-12-17 DIAGNOSIS — G8929 Other chronic pain: Secondary | ICD-10-CM | POA: Diagnosis not present

## 2019-12-17 DIAGNOSIS — I10 Essential (primary) hypertension: Secondary | ICD-10-CM | POA: Diagnosis not present

## 2019-12-17 DIAGNOSIS — E559 Vitamin D deficiency, unspecified: Secondary | ICD-10-CM

## 2019-12-17 DIAGNOSIS — E291 Testicular hypofunction: Secondary | ICD-10-CM

## 2019-12-17 NOTE — Progress Notes (Signed)
Metrics: Intervention Frequency ACO  Documented Smoking Status Yearly  Screened one or more times in 24 months  Cessation Counseling or  Active cessation medication Past 24 months  Past 24 months   Guideline developer: UpToDate (See UpToDate for funding source) Date Released: 2014       Wellness Office Visit  Subjective:  Patient ID: [name], male    DOB: August 10, 1970  Age: 49 y.o. MRN: [id number]  CC: This man comes in for follow-up of testosterone therapy, obesity, hypertension. HPI  He also has vitamin D deficiency and he has been taking vitamin D3 5000 units daily. He does feel better since starting testosterone therapy which he only takes 100 mg daily apply to the scrotal skin.  He has taken it today. He continues on Zestoretic for his hypertension. I do not think we have had a good full discussion regarding nutrition. Today is also complaining of chronic low back pain which is bilateral and more recently has had radiation posteriorly down the right leg.  The worrisome feature that he has is that it keeps him awake at night.  He denies any urinary or bowel symptoms.  The pain has been present for several years but it has been worse in the last 1 year.  He does not like to take any medicines for pain in general.  Past Medical History:  Diagnosis Date  . Hypertension   . Vitamin D deficiency    Past Surgical History:  Procedure Laterality Date  . APPENDECTOMY    . HERNIA REPAIR       Family History  Problem Relation Age of Onset  . Healthy Mother   . Cancer Father   . Lung cancer Father     Social History   Social History Narrative   Married for 8 months,second.Lives with wife and daughter.Moved from [place] in 2018.   Social History   Tobacco Use  . Smoking status: Never Smoker  . Smokeless tobacco: Never Used  Substance Use Topics  . Alcohol use: Yes    Alcohol/week: 18.0 standard drinks    Types: 18 Cans of beer per week    Current Meds  Medication  Sig  . Cholecalciferol 1.25 MG (50000 UT) TABS Take 5,000 tablets by mouth daily.  Marland Kitchen lisinopril-hydrochlorothiazide (ZESTORETIC) 10-12.5 MG tablet Take 1 tablet by mouth daily.  . Testosterone 20 % CREA Apply 100 mg topically daily.      Depression screen [facility] 2/9 08/01/2019 05/06/2019  Decreased Interest 0 0  Down, Depressed, Hopeless 0 0  PHQ - 2 Score 0 0     Objective:   Today's Vitals: BP 133/90 (BP Location: Right Arm, Patient Position: Sitting, Cuff Size: Normal)   Pulse 88   Temp (!) 97.5 F (36.4 C) (Temporal)   Resp 18   Ht 5\' 11"  (1.803 m)   Wt (!) 303 lb 3.2 oz (137.5 kg) Comment: has work on heavy steel toe boots,carhart gear heavy. A  SpO2 95%   BMI 42.29 kg/m  Vitals with BMI 12/17/2019 10/22/2019 10/03/2019  Height 5\' 11"  5\' 11"  5\' 11"   Weight 303 lbs 3 oz 291 lbs 10 oz 290 lbs  BMI 42.31 40.69 40.46  Systolic 133 128 10/05/2019  Diastolic 90 74 80  Pulse 88 72 78     Physical Exam   He remains morbidly obese.  Blood pressure elevated today.  Alert and orientated without any focal neurological signs.    Assessment   1. Essential hypertension, benign  2. Morbid obesity (HCC)   3. Testicular failure   4. Vitamin D deficiency   5. Chronic bilateral low back pain with right-sided sciatica       Tests ordered Orders Placed This Encounter  Procedures  . MR Lumbar Spine Wo Contrast  . Testosterone Total,Free,Bio, Males  . COMPLETE METABOLIC PANEL WITH GFR  . VITAMIN D 25 Hydroxy (Vit-D Deficiency, Fractures)     Plan: 1. He will continue with Zestoretic for his hypertension. 2. We discussed nutrition at length today and I introduced the concept of intermittent fasting combined with more of a plant-based diet and altering the timing of when he eats his meals, emphasizing the importance of not eating very late at night. 3. He will continue with testosterone cream and I will check levels today. 4. He will continue with vitamin D3 5000 units daily  and we will check vitamin D level today. 5. We will try to arrange an MRI lumbar spine for him to see if we can get more information.  If the insurance does not approve it, then I will send him to physical rehabilitation medicine. 6. I did also discussed COVID-19 vaccination and he was open to the discussion. 7. Follow-up in 2 months.   No orders of the defined types were placed in this encounter.   Wilson Singer, MD

## 2019-12-17 NOTE — Patient Instructions (Signed)
[name] Dietary Recommendations for Weight Loss What to Avoid . Avoid added sugars o Often added sugar can be found in processed foods such as many condiments, dry cereals, cakes, cookies, chips, crisps, crackers, candies, sweetened drinks, etc.  o Read labels and AVOID/DECREASE use of foods with the following in their ingredient list: Sugar, fructose, high fructose corn syrup, sucrose, glucose, maltose, dextrose, molasses, cane sugar, brown sugar, any type of syrup, agave nectar, etc.   . Avoid snacking in between meals . Avoid foods made with flour o If you are going to eat food made with flour, choose those made with whole-grains; and, minimize your consumption as much as is tolerable . Avoid processed foods o These foods are generally stocked in the middle of the grocery store. Focus on shopping on the perimeter of the grocery.  . Avoid Meat  o We recommend following a plant-based diet at [name]. Thus, we recommend avoiding meat as a general rule. Consider eating beans, legumes, eggs, and/or dairy products for regular protein sources o If you plan on eating meat limit to 4 ounces of meat at a time and choose lean options such as Fish, chicken, turkey. Avoid red meat intake such as pork and/or steak What to Include . Vegetables o GREEN LEAFY VEGETABLES: Kale, spinach, mustard greens, collard greens, cabbage, broccoli, etc. o OTHER: Asparagus, cauliflower, eggplant, carrots, peas, Brussel sprouts, tomatoes, bell peppers, zucchini, beets, cucumbers, etc. . Grains, seeds, and legumes o Beans: kidney beans, black eyed peas, garbanzo beans, black beans, pinto beans, etc. o Whole, unrefined grains: brown rice, barley, bulgur, oatmeal, etc. . Healthy fats  o Avoid highly processed fats such as vegetable oil o Examples of healthy fats: avocado, olives, virgin olive oil, dark chocolate (?72% Cocoa), nuts (peanuts, almonds, walnuts, cashews, pecans, etc.) . None to Low  Intake of Animal Sources of Protein o Meat sources: chicken, turkey, salmon, tuna. Limit to 4 ounces of meat at one time. o Consider limiting dairy sources, but when choosing dairy focus on: PLAIN Greek yogurt, cottage cheese, high-protein milk . Fruit o Choose berries  When to Eat . Intermittent Fasting: o Choosing not to eat for a specific time period, but DO FOCUS ON HYDRATION when fasting o Multiple Techniques: - Time Restricted Eating: eat 3 meals in a day, each meal lasting no more than 60 minutes, no snacks between meals - 16-18 hour fast: fast for 16 to 18 hours up to 7 days a week. Often suggested to start with 2-3 nonconsecutive days per week.  . Remember the time you sleep is counted as fasting.  . Examples of eating schedule: Fast from 7:00pm-11:00am. Eat between 11:00am-7:00pm.  - 24-hour fast: fast for 24 hours up to every other day. Often suggested to start with 1 day per week . Remember the time you sleep is counted as fasting . Examples of eating schedule:  o Eating day: eat 2-3 meals on your eating day. If doing 2 meals, each meal should last no more than 90 minutes. If doing 3 meals, each meal should last no more than 60 minutes. Finish last meal by 7:00pm. o Fasting day: Fast until 7:00pm.  o IF YOU FEEL UNWELL FOR ANY REASON/IN ANY WAY WHEN FASTING, STOP FASTING BY EATING A NUTRITIOUS SNACK OR LIGHT MEAL o ALWAYS FOCUS ON HYDRATION DURING FASTS - Acceptable Hydration sources: water, broths, tea/coffee (black tea/coffee is best but using a small amount of whole-fat dairy products in coffee/tea is acceptable).  -   Poor Hydration Sources: anything with sugar or artificial sweeteners added to it  These recommendations have been developed for patients that are actively receiving medical care from either Dr. Johnothan Bascomb or Sarah Gray, DNP, NP-C at [name]. These recommendations are developed for patients with specific medical conditions and are not meant to be  distributed or used by others that are not actively receiving care from either provider listed above at [name]. It is not appropriate to participate in the above eating plans without proper medical supervision.   Reference: Fung, J. The obesity code. [place]: [place]; 2016.   

## 2019-12-18 LAB — TESTOSTERONE TOTAL,FREE,BIO, MALES
Albumin: 4.6 g/dL (ref 3.6–5.1)
Sex Hormone Binding: 19 nmol/L (ref 10–50)
Testosterone, Bioavailable: 422.6 ng/dL (ref >=110.0)
Testosterone, Free: 201.2 pg/mL (ref 46.0–224.0)
Testosterone: 873 ng/dL — ABNORMAL HIGH (ref 250–827)

## 2019-12-18 LAB — COMPLETE METABOLIC PANEL WITH GFR
AG Ratio: 1.8 (calc) (ref 1.0–2.5)
ALT: 28 U/L (ref 9–46)
AST: 23 U/L (ref 10–35)
Albumin: 4.6 g/dL (ref 3.6–5.1)
Alkaline phosphatase (APISO): 70 U/L (ref 35–144)
BUN: 12 mg/dL (ref 7–25)
CO2: 30 mmol/L (ref 20–32)
Calcium: 9.5 mg/dL (ref 8.6–10.3)
Chloride: 99 mmol/L (ref 98–110)
Creat: 1.01 mg/dL (ref 0.70–1.33)
GFR, Est African American: 100 mL/min/{1.73_m2} (ref >=60)
GFR, Est Non African American: 86 mL/min/{1.73_m2} (ref >=60)
Globulin: 2.6 g/dL (calc) (ref 1.9–3.7)
Glucose, Bld: 103 mg/dL — ABNORMAL HIGH (ref 65–99)
Potassium: 5.2 mmol/L (ref 3.5–5.3)
Sodium: 138 mmol/L (ref 135–146)
Total Bilirubin: 0.6 mg/dL (ref 0.2–1.2)
Total Protein: 7.2 g/dL (ref 6.1–8.1)

## 2019-12-18 LAB — VITAMIN D 25 HYDROXY (VIT D DEFICIENCY, FRACTURES): Vit D, 25-Hydroxy: 35 ng/mL (ref 30–100)

## 2019-12-27 ENCOUNTER — Ambulatory Visit (HOSPITAL_COMMUNITY)
Admission: RE | Admit: 2019-12-27 | Discharge: 2019-12-27 | Disposition: A | Discharge status: Home / Self Care | Payer: Medicaid Other | Source: Ambulatory Visit | Attending: Internal Medicine | Admitting: Internal Medicine

## 2019-12-27 ENCOUNTER — Other Ambulatory Visit: Payer: Self-pay

## 2019-12-27 DIAGNOSIS — M5441 Lumbago with sciatica, right side: Secondary | ICD-10-CM | POA: Insufficient documentation

## 2019-12-27 DIAGNOSIS — G8929 Other chronic pain: Secondary | ICD-10-CM | POA: Diagnosis present

## 2019-12-27 DIAGNOSIS — M545 Low back pain, unspecified: Secondary | ICD-10-CM | POA: Diagnosis not present

## 2019-12-29 ENCOUNTER — Encounter (INDEPENDENT_AMBULATORY_CARE_PROVIDER_SITE_OTHER): Payer: Self-pay | Admitting: Internal Medicine

## 2019-12-30 ENCOUNTER — Other Ambulatory Visit (INDEPENDENT_AMBULATORY_CARE_PROVIDER_SITE_OTHER): Payer: Self-pay | Admitting: Internal Medicine

## 2019-12-30 DIAGNOSIS — G8929 Other chronic pain: Secondary | ICD-10-CM

## 2019-12-30 DIAGNOSIS — M5441 Lumbago with sciatica, right side: Secondary | ICD-10-CM

## 2020-01-02 ENCOUNTER — Encounter (INDEPENDENT_AMBULATORY_CARE_PROVIDER_SITE_OTHER): Payer: Self-pay | Admitting: Internal Medicine

## 2020-02-19 ENCOUNTER — Ambulatory Visit (INDEPENDENT_AMBULATORY_CARE_PROVIDER_SITE_OTHER): Payer: Medicaid Other | Admitting: Internal Medicine

## 2020-02-19 ENCOUNTER — Encounter (INDEPENDENT_AMBULATORY_CARE_PROVIDER_SITE_OTHER): Payer: Self-pay | Admitting: Internal Medicine

## 2020-02-19 ENCOUNTER — Other Ambulatory Visit: Payer: Self-pay

## 2020-02-19 VITALS — BP 148/86 | HR 86 | Temp 97.7°F | Ht 70.0 in | Wt 294.0 lb

## 2020-02-19 DIAGNOSIS — E559 Vitamin D deficiency, unspecified: Secondary | ICD-10-CM

## 2020-02-19 DIAGNOSIS — G8929 Other chronic pain: Secondary | ICD-10-CM | POA: Diagnosis not present

## 2020-02-19 DIAGNOSIS — E291 Testicular hypofunction: Secondary | ICD-10-CM | POA: Diagnosis not present

## 2020-02-19 DIAGNOSIS — I10 Essential (primary) hypertension: Secondary | ICD-10-CM

## 2020-02-19 DIAGNOSIS — M5441 Lumbago with sciatica, right side: Secondary | ICD-10-CM | POA: Diagnosis not present

## 2020-02-19 NOTE — Progress Notes (Signed)
Metrics: Intervention Frequency ACO  Documented Smoking Status Yearly  Screened one or more times in 24 months  Cessation Counseling or  Active cessation medication Past 24 months  Past 24 months   Guideline developer: UpToDate (See UpToDate for funding source) Date Released: 2014       Wellness Office Visit  Subjective:  Patient ID: [name], male    DOB: [birth year]  Age: 50 y.o. MRN: [id number]  CC: This man comes in for follow-up of hypertension, vitamin D deficiency, morbid obesity and testosterone therapy. HPI  Since the last time I saw him, he is eating healthier, doing intermittent fasting and has managed to lose weight of around 10 pounds. He tolerates and is doing well reasonably with testosterone therapy in terms of energy but his libido has not significantly improved. He has now been taking vitamin D3 10,000 units daily He continues to have chronic low back pain and MRI of the lumbar spine was not very remarkable. Past Medical History:  Diagnosis Date  . Hypertension   . Vitamin D deficiency    Past Surgical History:  Procedure Laterality Date  . APPENDECTOMY    . HERNIA REPAIR       Family History  Problem Relation Age of Onset  . Healthy Mother   . Cancer Father   . Lung cancer Father     Social History   Social History Narrative   Married for 8 months,second.Lives with wife and daughter.Moved from [place] in 2018.   Social History   Tobacco Use  . Smoking status: Never Smoker  . Smokeless tobacco: Never Used  Substance Use Topics  . Alcohol use: Yes    Alcohol/week: 18.0 standard drinks    Types: 18 Cans of beer per week    Current Meds  Medication Sig  . Cholecalciferol 1.25 MG (50000 UT) TABS Take 2 tablets by mouth daily.  Marland Kitchen lisinopril-hydrochlorothiazide (ZESTORETIC) 10-12.5 MG tablet Take 1 tablet by mouth daily.  . Testosterone 20 % CREA Apply 100 mg topically daily.      Depression screen [facility] 2/9 02/19/2020 08/01/2019 05/06/2019   Decreased Interest 0 0 0  Down, Depressed, Hopeless 0 0 0  PHQ - 2 Score 0 0 0     Objective:   Today's Vitals: BP (!) 148/86   Pulse 86   Temp 97.7 F (36.5 C) (Temporal)   Ht 5\' 10"  (1.778 m)   Wt 294 lb (133.4 kg)   SpO2 97%   BMI 42.18 kg/m  Vitals with BMI 02/19/2020 12/17/2019 10/22/2019  Height 5\' 10"  5\' 11"  5\' 11"   Weight 294 lbs 303 lbs 3 oz 291 lbs 10 oz  BMI 42.18 42.31 40.69  Systolic 148 133 12/22/2019  Diastolic 86 90 74  Pulse 86 88 72     Physical Exam   He looks systemically well.  Blood pressure elevated somewhat today.  However, he has lost almost 10 pounds since the last time I saw him.    Assessment   1. Chronic bilateral low back pain with right-sided sciatica   2. Essential hypertension, benign   3. Testicular failure   4. Morbid obesity (HCC)   5. Vitamin D deficiency       Tests ordered Orders Placed This Encounter  Procedures  . COMPLETE METABOLIC PANEL WITH GFR  . VITAMIN D 25 Hydroxy (Vit-D Deficiency, Fractures)  . Ambulatory referral to Physical Medicine Rehab     Plan: 1. As far as his chronic low back pain is concerned,  I am going to refer him to physical medicine rehabilitation. 2. He will continue with antihypertensive medication as before. 3. After discussion, we agreed the he should increase the testosterone therapy so he is taking 2 clicks twice a day which would represent 100 mg twice a day. 4. Blood work is ordered. 5. Follow-up in 3 months.   No orders of the defined types were placed in this encounter.   Wilson Singer, MD

## 2020-02-20 LAB — COMPLETE METABOLIC PANEL WITH GFR
AG Ratio: 1.4 (calc) (ref 1.0–2.5)
ALT: 33 U/L (ref 9–46)
AST: 23 U/L (ref 10–35)
Albumin: 4.3 g/dL (ref 3.6–5.1)
Alkaline phosphatase (APISO): 68 U/L (ref 35–144)
BUN: 11 mg/dL (ref 7–25)
CO2: 29 mmol/L (ref 20–32)
Calcium: 9.5 mg/dL (ref 8.6–10.3)
Chloride: 100 mmol/L (ref 98–110)
Creat: 1.1 mg/dL (ref 0.70–1.33)
GFR, Est African American: 90 mL/min/{1.73_m2} (ref >=60)
GFR, Est Non African American: 77 mL/min/{1.73_m2} (ref >=60)
Globulin: 3 g/dL (calc) (ref 1.9–3.7)
Glucose, Bld: 104 mg/dL (ref 65–139)
Potassium: 4.9 mmol/L (ref 3.5–5.3)
Sodium: 139 mmol/L (ref 135–146)
Total Bilirubin: 0.5 mg/dL (ref 0.2–1.2)
Total Protein: 7.3 g/dL (ref 6.1–8.1)

## 2020-02-20 LAB — VITAMIN D 25 HYDROXY (VIT D DEFICIENCY, FRACTURES): Vit D, 25-Hydroxy: 52 ng/mL (ref 30–100)

## 2020-03-02 ENCOUNTER — Other Ambulatory Visit (INDEPENDENT_AMBULATORY_CARE_PROVIDER_SITE_OTHER): Payer: Self-pay | Admitting: Internal Medicine

## 2020-03-02 ENCOUNTER — Encounter (INDEPENDENT_AMBULATORY_CARE_PROVIDER_SITE_OTHER): Payer: Self-pay | Admitting: Internal Medicine

## 2020-03-02 MED ORDER — TESTOSTERONE 20 % CREA: 100.0000 mg | TOPICAL_CREAM | Freq: Two times a day (BID) | 0 refills | Status: DC

## 2020-03-26 ENCOUNTER — Encounter (INDEPENDENT_AMBULATORY_CARE_PROVIDER_SITE_OTHER): Payer: Self-pay | Admitting: Internal Medicine

## 2020-04-14 ENCOUNTER — Encounter: Payer: Self-pay | Admitting: Physical Medicine & Rehabilitation

## 2020-05-14 ENCOUNTER — Encounter: Payer: Medicaid Other | Attending: Physical Medicine & Rehabilitation | Admitting: Physical Medicine & Rehabilitation

## 2020-05-14 ENCOUNTER — Encounter: Payer: Self-pay | Admitting: Physical Medicine & Rehabilitation

## 2020-05-14 ENCOUNTER — Other Ambulatory Visit: Payer: Self-pay

## 2020-05-14 VITALS — BP 152/89 | HR 78 | Temp 98.0°F | Ht 71.0 in | Wt 279.0 lb

## 2020-05-14 DIAGNOSIS — M545 Low back pain, unspecified: Secondary | ICD-10-CM | POA: Diagnosis not present

## 2020-05-14 DIAGNOSIS — M47816 Spondylosis without myelopathy or radiculopathy, lumbar region: Secondary | ICD-10-CM

## 2020-05-14 DIAGNOSIS — G8929 Other chronic pain: Secondary | ICD-10-CM | POA: Diagnosis not present

## 2020-05-14 NOTE — Progress Notes (Signed)
Subjective:    Patient ID: Paul [name], male    DOB: Oct 14, 1970, 50 y.o.   MRN: [id number]  HPI   CC: exacerbation of chronic low back pain  50 year old male kindly referred by his primary care physician Dr. Irving Shows for the evaluation of low back pain. 10y hx of chronic back pain, hx of working Holiday representative , worsened pain starting ~95yr ago, no apparent injury.  Has been doing some part time jobs since moving to [place] ~19yrs ago.  The patient has been more physically inactive over the last 2 years and has gained about 40 pounds.  Fortunately he is lost around 20 pounds but still up over her usual weight and less physically active in general.  He does do some yard work around the house.  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   Pain at waistline, no radiating pain.  Pain increased with bending also while in bed , difficulty laying on back   No therapy Has taken advil for pain which has helped  Uses heating pad and TENs   Tries to stretch legs in am   PMH- Low Vit D, testosterone, +HTN  Pain Inventory Average Pain 8 Pain Right Now 8 My pain is constant, sharp, burning and aching  In the last 24 hours, has pain interfered with the following? General activity 10 Relation with others 6 Enjoyment of life 6 What TIME of day is your pain at its worst? night Sleep (in general) Poor  Pain is worse with: bending and inactivity Pain improves with: heat/ice and TENS Relief from Meds: 0  walk without assistance how many minutes can you walk? all day ability to climb steps?  yes do you drive?  yes transfers alone Do you have any goals in this area?  no  not employed: date last employed 2018  numbness tingling dizziness  new  new    Family History  Problem Relation Age of Onset  . Healthy Mother   . Cancer Father   . Lung cancer Father    Social History   Socioeconomic History  . Marital status: Married    Spouse name: Not on file  . Number of  children: Not on file  . Years of education: Not on file  . Highest education level: Not on file  Occupational History  . Occupation: unemployed  Tobacco Use  . Smoking status: Never Smoker  . Smokeless tobacco: Never Used  Vaping Use  . Vaping Use: Never used  Substance and Sexual Activity  . Alcohol use: Yes    Alcohol/week: 18.0 standard drinks    Types: 18 Cans of beer per week  . Drug use: Never  . Sexual activity: Not on file  Other Topics Concern  . Not on file  Social History Narrative   Married for 8 months,second.Lives with wife and daughter.Moved from [place] in 2018.   Social Determinants of Health   Financial Resource Strain: Not on file  Food Insecurity: Not on file  Transportation Needs: Not on file  Physical Activity: Not on file  Stress: Not on file  Social Connections: Not on file   Past Surgical History:  Procedure Laterality Date  . APPENDECTOMY    . HERNIA REPAIR     Past Medical History:  Diagnosis Date  . Hypertension   . Vitamin D deficiency    BP (!) 152/89   Pulse 78   Temp 98 F (36.7 C)   Ht 5\' 11"  (1.803 m)   Wt 279  lb (126.6 kg)   SpO2 96%   BMI 38.91 kg/m   Opioid Risk Score:   Fall Risk Score:  `1  Depression screen PHQ 2/9  Depression screen [facility] 2/9 05/14/2020 02/19/2020 08/01/2019 05/06/2019  Decreased Interest 1 0 0 0  Down, Depressed, Hopeless 1 0 0 0  PHQ - 2 Score 2 0 0 0  Altered sleeping 3 - - -  Tired, decreased energy 0 - - -  Change in appetite 2 - - -  Feeling bad or failure about yourself  0 - - -  Trouble concentrating 0 - - -  Moving slowly or fidgety/restless 0 - - -  Suicidal thoughts 0 - - -  PHQ-9 Score 7 - - -  Difficult doing work/chores Somewhat difficult - - -    Review of Systems  Constitutional: Negative.   HENT: Negative.   Respiratory: Negative.   Cardiovascular: Negative.   Gastrointestinal: Negative.   Endocrine: Negative.   Genitourinary: Negative.   Musculoskeletal: Positive for  arthralgias and back pain.  Skin: Negative.   Allergic/Immunologic: Negative.   Neurological: Positive for dizziness and numbness.       Tingling  Psychiatric/Behavioral: Negative.   All other systems reviewed and are negative.      Objective:   Physical Exam Vitals reviewed.  Constitutional:      Appearance: He is obese.  HENT:     Head: Normocephalic and atraumatic.  Eyes:     Extraocular Movements: Extraocular movements intact.     Conjunctiva/sclera: Conjunctivae normal.     Pupils: Pupils are equal, round, and reactive to light.  Cardiovascular:     Rate and Rhythm: Normal rate and regular rhythm.     Heart sounds: Normal heart sounds.  Pulmonary:     Effort: Pulmonary effort is normal. No respiratory distress.     Breath sounds: Normal breath sounds.  Abdominal:     General: Abdomen is flat. Bowel sounds are normal. There is no distension.     Palpations: Abdomen is soft.  Musculoskeletal:     Cervical back: No tenderness.     Right lower leg: No edema.     Left lower leg: No edema.     Comments: Thoracic and lumbar range of motion 50% flexion extension lateral bending and twisting.  Patient has pain at end range in all movements. No evidence of knee effusion bilaterally there is mildly diminished left and right hip internal and external rotation Negative Faber's Negative distraction test Negative thigh thrust test bilaterally. There is pain with lumbar extension greater than with flexion.  There is lumbar paraspinal tenderness bilaterally in the lumbosacral junction area  Neurological:     Mental Status: He is alert and oriented to person, place, and time.     Sensory: Sensation is intact.     Motor: Motor function is intact.     Coordination: Coordination is intact.     Gait: Gait is intact.     Comments: Motor strength is 5/5 bilateral deltoid bicep tricep grip hip flexor knee extension ankle dorsiflexion and plantarflexion. Negative straight leg raising test  bilaterally Gait without evidence of toe drag or knee instability.  Sensation is intact light touch and pinprick bilateral upper and lower limbs    MRI LUMBAR SPINE WITHOUT CONTRAST  TECHNIQUE: Multiplanar, multisequence MR imaging of the lumbar spine was performed. No intravenous contrast was administered.  COMPARISON:  None.  FINDINGS: Segmentation:  Standard.  Alignment:  Maintained.  Vertebrae: No fracture, evidence  of discitis, or bone lesion. Scattered, mild degenerative endplate signal change noted.  Conus medullaris and cauda equina: Conus extends to the L1 level. Conus and cauda equina appear normal.  Paraspinal and other soft tissues: Negative.  Disc levels:  T12-L1 is imaged in the sagittal plane only and negative.  L1-2: Negative.  L2-3: Negative.  L3-4: Shallow disc bulge without stenosis.  L4-5: Shallow disc bulge and mild facet degenerative change without stenosis.  L5-S1: Shallow disc bulge without stenosis.  IMPRESSION: Mild lumbar degenerative change without central canal or foraminal stenosis.   Electronically Signed   By: Drusilla Kanner M.D.   On: 12/28/2019 15:58  Reviewed MRI with the patient including actual films     Assessment & Plan:  1.  Lumbar axial pain appears to be at the lumbosacral area.  No radicular symptoms.  No exam evidence for sacroiliac disorder. 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  We discussed the importance of weight loss given the postural changes with increased weight that puts more stress on facet joints. He does get good relief with taking Advil at night which I think is reasonable on a short-term basis.  I have given him some exercises to do on a daily basis. I will see him back in 4 weeks if not much better may consider lumbar medial branch blocks. Discussed with patient agrees with plan.

## 2020-05-14 NOTE — Patient Instructions (Signed)

## 2020-05-15 ENCOUNTER — Encounter (INDEPENDENT_AMBULATORY_CARE_PROVIDER_SITE_OTHER): Payer: Self-pay | Admitting: Internal Medicine

## 2020-05-19 ENCOUNTER — Encounter (INDEPENDENT_AMBULATORY_CARE_PROVIDER_SITE_OTHER): Payer: Self-pay | Admitting: Internal Medicine

## 2020-05-19 ENCOUNTER — Other Ambulatory Visit: Payer: Self-pay

## 2020-05-19 ENCOUNTER — Ambulatory Visit (INDEPENDENT_AMBULATORY_CARE_PROVIDER_SITE_OTHER): Payer: Medicaid Other | Admitting: Internal Medicine

## 2020-05-19 VITALS — BP 134/88 | HR 72 | Temp 97.3°F | Ht 70.0 in | Wt 280.2 lb

## 2020-05-19 DIAGNOSIS — I10 Essential (primary) hypertension: Secondary | ICD-10-CM | POA: Diagnosis not present

## 2020-05-19 DIAGNOSIS — G8929 Other chronic pain: Secondary | ICD-10-CM

## 2020-05-19 DIAGNOSIS — Z1211 Encounter for screening for malignant neoplasm of colon: Secondary | ICD-10-CM

## 2020-05-19 DIAGNOSIS — E291 Testicular hypofunction: Secondary | ICD-10-CM

## 2020-05-19 DIAGNOSIS — M5441 Lumbago with sciatica, right side: Secondary | ICD-10-CM

## 2020-05-19 NOTE — Progress Notes (Signed)
Metrics: Intervention Frequency ACO  Documented Smoking Status Yearly  Screened one or more times in 24 months  Cessation Counseling or  Active cessation medication Past 24 months  Past 24 months   Guideline developer: UpToDate (See UpToDate for funding source) Date Released: 2014       Wellness Office Visit  Subjective:  Patient ID: Paul [name], male    DOB: 1970/03/25  Age: 50 y.o. MRN: [id number]  CC: This man comes in for follow-up of hypertension, morbid obesity, hypogonadism, vitamin D deficiency. HPI  He is seen in the physical rehab medicine specialist for his chronic low back pain and has been asked to do some back exercises which he is doing. He continues on antihypertensive therapy as before. On the last visit, we had agreed to increase the testosterone cream to 2 clicks twice a day but he had been taking 4 clicks once a day and felt it was too much so he therefore stopped doing this and is now back to taking 2 clicks once a day.  He does need a refill. He continues on vitamin D3 supplementation for vitamin D deficiency. He has been working fairly hard with nutrition with intermittent fasting although he has not really changed significantly the type of food he does eat when he does eat. Past Medical History:  Diagnosis Date  . Hypertension   . Vitamin D deficiency    Past Surgical History:  Procedure Laterality Date  . APPENDECTOMY    . HERNIA REPAIR       Family History  Problem Relation Age of Onset  . Healthy Mother   . Cancer Father   . Lung cancer Father     Social History   Social History Narrative   Married for 8 months,second.Lives with wife and daughter.Moved from [place] in 2018.   Social History   Tobacco Use  . Smoking status: Never Smoker  . Smokeless tobacco: Never Used  Substance Use Topics  . Alcohol use: Yes    Alcohol/week: 18.0 standard drinks    Types: 18 Cans of beer per week    Current Meds  Medication Sig  .  Cholecalciferol 1.25 MG (50000 UT) TABS Take 2 tablets by mouth daily.  Marland Kitchen lisinopril-hydrochlorothiazide (ZESTORETIC) 10-12.5 MG tablet Take 1 tablet by mouth daily.  . MULTIPLE VITAMIN PO Take by mouth. Takes 2 gummy vitamins a day  . Testosterone 20 % CREA Apply 100 mg topically 2 (two) times daily.     Flowsheet Row Office Visit from 05/14/2020 in [facility]  PHQ-9 Total Score 7      Objective:   Today's Vitals: BP 134/88   Pulse 72   Temp (!) 97.3 F (36.3 C) (Temporal)   Ht 5\' 10"  (1.778 m)   Wt 280 lb 3.2 oz (127.1 kg)   SpO2 98%   BMI 40.20 kg/m  Vitals with BMI 05/19/2020 05/14/2020 02/19/2020  Height 5\' 10"  5\' 11"  5\' 10"   Weight 280 lbs 3 oz 279 lbs 294 lbs  BMI 40.2 38.93 42.18  Systolic 134 152 04/18/2020  Diastolic 88 89 86  Pulse 72 78 86     Physical Exam He looks systemically well, remains morbidly obese but has lost 14 pounds since the last time I saw him.  Blood pressure is also improving.     Assessment   1. Essential hypertension, benign   2. Chronic bilateral low back pain with right-sided sciatica   3. Morbid obesity (HCC)   4.  Testicular failure   5. Colon cancer screening       Tests ordered Orders Placed This Encounter  Procedures  . Ambulatory referral to Gastroenterology     Plan: 1. Continue with antihypertensive medication as before.  Renal function was normal on the last visit. 2. I clarified that he needs to take the testosterone cream 2 clicks twice a day approximately 12 hours apart and this is going to be more doable for him. 3. Continue with nutrition as before. 4. Continue with the back stretching exercises that have been recommended. 5. I will see him in about 3 months time for follow-up.  In the meantime, I will refer him to gastroenterology for screening colonoscopy.   No orders of the defined types were placed in this encounter.   Wilson Singer, MD

## 2020-05-20 ENCOUNTER — Other Ambulatory Visit (INDEPENDENT_AMBULATORY_CARE_PROVIDER_SITE_OTHER): Payer: Self-pay | Admitting: Internal Medicine

## 2020-05-20 MED ORDER — TESTOSTERONE 20 % CREA: 100.0000 mg | TOPICAL_CREAM | Freq: Two times a day (BID) | 2 refills | Status: DC

## 2020-05-28 ENCOUNTER — Encounter: Payer: Self-pay | Admitting: *Deleted

## 2020-06-02 ENCOUNTER — Other Ambulatory Visit (INDEPENDENT_AMBULATORY_CARE_PROVIDER_SITE_OTHER): Payer: Self-pay | Admitting: Internal Medicine

## 2020-06-02 MED ORDER — TESTOSTERONE 20 % CREA: 100.0000 mg | TOPICAL_CREAM | Freq: Two times a day (BID) | 2 refills | Status: DC

## 2020-06-12 ENCOUNTER — Encounter: Payer: Self-pay | Admitting: Physical Medicine & Rehabilitation

## 2020-06-12 ENCOUNTER — Other Ambulatory Visit: Payer: Self-pay

## 2020-06-12 ENCOUNTER — Encounter: Payer: Medicaid Other | Attending: Physical Medicine & Rehabilitation | Admitting: Physical Medicine & Rehabilitation

## 2020-06-12 VITALS — BP 165/83 | HR 62 | Temp 98.3°F | Ht 70.0 in | Wt 277.2 lb

## 2020-06-12 DIAGNOSIS — M47816 Spondylosis without myelopathy or radiculopathy, lumbar region: Secondary | ICD-10-CM | POA: Insufficient documentation

## 2020-06-12 DIAGNOSIS — G8929 Other chronic pain: Secondary | ICD-10-CM | POA: Diagnosis not present

## 2020-06-12 DIAGNOSIS — M545 Low back pain, unspecified: Secondary | ICD-10-CM | POA: Insufficient documentation

## 2020-06-12 MED ORDER — MELOXICAM 15 MG PO TABS: 15.0000 mg | ORAL_TABLET | Freq: Every day | ORAL | 1 refills | Status: DC

## 2020-06-12 NOTE — Progress Notes (Signed)
Subjective:    Patient ID: Paul [name], male    DOB: Oct 04, 1970, 50 y.o.   MRN: [id number] 50 year old male kindly referred by his primary care physician Dr. Irving Shows for the evaluation of low back pain. 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  Has been doing some part time jobs since moving to [place] ~28yrs ago.  The patient has been more physically inactive over the last 2 years and has gained about 40 pounds.  Fortunately he is lost around 20 pounds but still up over her usual weight and less physically active in general.  He does do some yard work around the house.  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   Pain at waistline, no radiating pain.  Pain increased with bending also while in bed , difficulty laying on back   No therapy Has taken advil for pain which has helped  Uses heating pad and TENs HPI  Patient is here for follow-up after performing home exercise program.  Unfortunately his pain is not any better. Worsened Right sided mid back pain over the last couple weeks since sitting at a picnic table.  Tried back exercises without relief His low back pain remains intermittent worsened with extending his spine as well as bending toward the left side. Not much relief with advil  Pain Inventory Average Pain 8 Pain Right Now 8 My pain is sharp and aching  In the last 24 hours, has pain interfered with the following? General activity 8 Relation with others 8 Enjoyment of life 9 What TIME of day is your pain at its worst? morning  and evening Sleep (in general) Poor  Pain is worse with: sitting and some activites Pain improves with: medication Relief from Meds: not sure  Family History  Problem Relation Age of Onset  . Healthy Mother   . Cancer Father   . Lung cancer Father    Social History   Socioeconomic History  . Marital status: Married    Spouse name: Not on file  . Number of  children: Not on file  . Years of education: Not on file  . Highest education level: Not on file  Occupational History  . Occupation: unemployed  Tobacco Use  . Smoking status: Never Smoker  . Smokeless tobacco: Never Used  Vaping Use  . Vaping Use: Never used  Substance and Sexual Activity  . Alcohol use: Yes    Alcohol/week: 18.0 standard drinks    Types: 18 Cans of beer per week  . Drug use: Never  . Sexual activity: Not on file  Other Topics Concern  . Not on file  Social History Narrative   Married for 8 months,second.Lives with wife and daughter.Moved from [place] in 2018.   Social Determinants of Health   Financial Resource Strain: Not on file  Food Insecurity: Not on file  Transportation Needs: Not on file  Physical Activity: Not on file  Stress: Not on file  Social Connections: Not on file   Past Surgical History:  Procedure Laterality Date  . APPENDECTOMY    . HERNIA REPAIR     Past Surgical History:  Procedure Laterality Date  . APPENDECTOMY    . HERNIA REPAIR     Past Medical History:  Diagnosis Date  . Hypertension   . Vitamin D deficiency    BP (!) 165/83   Pulse 62   Temp 98.3 F (36.8 C)   Ht  5\' 10"  (1.778 m)   Wt 277 lb 3.2 oz (125.7 kg)   SpO2 97%   BMI 39.77 kg/m   Opioid Risk Score:   Fall Risk Score:  `1  Depression screen PHQ 2/9  Depression screen [facility] 2/9 06/12/2020 05/14/2020 02/19/2020 08/01/2019 05/06/2019  Decreased Interest 0 1 0 0 0  Down, Depressed, Hopeless 0 1 0 0 0  PHQ - 2 Score 0 2 0 0 0  Altered sleeping - 3 - - -  Tired, decreased energy - 0 - - -  Change in appetite - 2 - - -  Feeling bad or failure about yourself  - 0 - - -  Trouble concentrating - 0 - - -  Moving slowly or fidgety/restless - 0 - - -  Suicidal thoughts - 0 - - -  PHQ-9 Score - 7 - - -  Difficult doing work/chores - Somewhat difficult - - -      Review of Systems  Constitutional: Negative.   HENT: Negative.   Eyes: Negative.   Respiratory:  Negative.   Cardiovascular: Negative.   Endocrine: Negative.   Genitourinary: Negative.   Musculoskeletal: Positive for back pain.  Skin: Negative.   Allergic/Immunologic: Negative.   Neurological: Negative.   Hematological: Negative.   Psychiatric/Behavioral: Negative.        Objective:   Physical Exam Vitals and nursing note reviewed.  Constitutional:      Appearance: He is obese.  Eyes:     Extraocular Movements: Extraocular movements intact.     Conjunctiva/sclera: Conjunctivae normal.     Pupils: Pupils are equal, round, and reactive to light.  Musculoskeletal:     Comments: Pain in the right lumbar area exacerbated by leaning toward the left side, lateral bending Pain is also with end range flexion as well as extension.  Skin:    General: Skin is warm and dry.  Neurological:     Mental Status: He is alert and oriented to person, place, and time.     Comments: Motor strength is 5/5 bilateral hip flexor knee extensor ankle dorsiflexor and plantar flexor Negative straight leg raising bilaterally Tone is normal  Psychiatric:        Mood and Affect: Mood normal.        Behavior: Behavior normal.    Tenderness in the right lumbar paraspinals from L1-L5       Assessment & Plan:  1.  Right-sided lumbar pain likely due to lumbar spondylosis pain is paraspinal along the entire lumbar spine, his more chronic pain is in the lower area corresponding to the L4-5 L5-S1 facet joints schedule for L3-L4-L5 medial branch blocks, under fluoroscopic guidance, contrast-enhanced. 2.  Right upper lumbar pain this appears to be more acute discussed with patient this could be upper lumbar facets versus lumbar disc he has had good relief with chiropractor in the past I feel that he may benefit from this.  We will also write for Mobic 15 mg/day.  We will see how this is doing when I see him back for the lower lumbar injection.

## 2020-06-12 NOTE — Patient Instructions (Signed)
Will try stronger anti inflammatory fo rRight midback pain  Will do injections for Right low back pain

## 2020-07-28 ENCOUNTER — Encounter: Payer: Self-pay | Admitting: Physical Medicine & Rehabilitation

## 2020-07-28 ENCOUNTER — Other Ambulatory Visit: Payer: Self-pay

## 2020-07-28 ENCOUNTER — Encounter: Payer: Medicaid Other | Attending: Physical Medicine & Rehabilitation | Admitting: Physical Medicine & Rehabilitation

## 2020-07-28 VITALS — BP 154/84 | HR 71 | Temp 98.0°F | Ht 70.0 in | Wt 280.2 lb

## 2020-07-28 DIAGNOSIS — M47816 Spondylosis without myelopathy or radiculopathy, lumbar region: Secondary | ICD-10-CM | POA: Insufficient documentation

## 2020-07-28 MED ORDER — MELOXICAM 15 MG PO TABS: 15.0000 mg | ORAL_TABLET | Freq: Every day | ORAL | 5 refills | Status: DC

## 2020-07-28 NOTE — Progress Notes (Signed)
Mr. Paul [name] returns today he was scheduled for lumbar medial branch blocks however he states that since he was started on meloxicam his pain has improved greatly.  He is back to doing outside yard work such as using a [employer]. We discussed that based on his mild degree of pain would not pursue medial branch blocks at the current time.  We will continue meloxicam 15 mg daily and if in the next month his pain remains at a well-controlled level he can try breaking it in half. We discussed that if he develops any GI upset he may need to stop the meloxicam.  He can certainly call us with any issues.  We will send note to his PCP.  Reviewed recent bmet which was normal

## 2020-07-28 NOTE — Progress Notes (Signed)
Procedure not done today, due to feeling better.

## 2020-07-28 NOTE — Patient Instructions (Signed)
Meloxicam capsules What is this medication? MELOXICAM (mel OX i cam) is a non-steroidal anti-inflammatory drug, also knownas an NSAID. It is used to treat pain, inflammation, and swelling. This medicine may be used for other purposes; ask your health care provider orpharmacist if you have questions. COMMON BRAND NAME(S): Vivlodex What should I tell my care team before I take this medication? They need to know if you have any of these conditions: asthma (lung or breathing disease) bleeding disorder coronary artery bypass graft (CABG) within the past 2 weeks dehydration heart attack heart disease heart failure high blood pressure if you often drink alcohol kidney disease liver disease smoke tobacco cigarettes stomach bleeding stomach ulcers, other stomach or intestine problems take medicines that treat or prevent blood clots taking other steroids like dexamethasone or prednisone an unusual or allergic reaction to meloxicam, other medicines, foods, dyes, or preservatives pregnant or trying to get pregnant breast-feeding How should I use this medication? Take this medicine by mouth. Take it as directed on the prescription label at the same time every day. You can take it with or without food. If it upsets your stomach, take it with food. Do not use it more often than directed. There may be unused or extra doses in the bottle after you finish your treatment.Talk to your health care provider if you have questions about your dose. A special MedGuide will be given to you by the pharmacist with eachprescription and refill. Be sure to read this information carefully each time. Talk to your health care provider about the use of this medicine in children.Special care may be needed. Patients over 65 years of age may have a stronger reaction and need a smallerdose. Overdosage: If you think you have taken too much of this medicine contact apoison control center or emergency room at once. NOTE:  This medicine is only for you. Do not share this medicine with others. What if I miss a dose? If you miss a dose, take it as soon as you can. If it is almost time for yournext dose, take only that dose. Do not take double or extra doses. What may interact with this medication? Do not take this medicine with any of the following medications: cidofovir ketorolac This medicine may also interact with the following medications: aspirin and aspirin-like medicines certain medicines for blood pressure, heart disease, irregular heart beat certain medicines for depression, anxiety, or psychotic disturbances certain medicines that treat or prevent blood clots like warfarin, enoxaparin, dalteparin, apixaban, dabigatran, rivaroxaban cyclosporine diuretics fluconazole lithium methotrexate other NSAIDs, medicines for pain and inflammation, like ibuprofen and naproxen pemetrexed This list may not describe all possible interactions. Give your health care provider a list of all the medicines, herbs, non-prescription drugs, or dietary supplements you use. Also tell them if you smoke, drink alcohol, or use illegaldrugs. Some items may interact with your medicine. What should I watch for while using this medication? Visit your health care provider for regular checks on your progress. Tell your health care provider if your symptoms do not start to get better or if they getworse. Do not take other medicines that contain aspirin, ibuprofen, or naproxen with this medicine. Side effects such as stomach upset, nausea, or ulcers may be more likely to occur. Many non-prescription medicines contain aspirin,ibuprofen, or naproxen. Always read labels carefully. This medicine can cause serious ulcers and bleeding in the stomach. It can happen with no warning. Smoking, drinking alcohol, older age, and poor health can also increase risks. Call your   health care provider right away if you havestomach pain or blood in your vomit  or stool. This medicine does not prevent a heart attack or stroke. This medicine may increase the chance of a heart attack or stroke. The chance may increase the longer you use this medicine or if you have heart disease. If you take aspirin to prevent a heart attack or stroke, talk to your health care provider aboutusing this medicine. Alcohol may interfere with the effect of this medicine. Avoid alcoholic drinks. This medicine may cause serious skin reactions. They can happen weeks to months after starting the medicine. Contact your health care provider right away if you notice fevers or flu-like symptoms with a rash. The rash may be red or purple and then turn into blisters or peeling of the skin. Or, you might notice a red rash with swelling of the face, lips or lymph nodes in your neck or underyour arms. Talk to your health care provider if you are pregnant before taking this medicine. Taking this medicine between weeks 20 and 30 of pregnancy may harm your unborn baby. Your health care provider will monitor you closely if youneed to take it. After 30 weeks of pregnancy, do not take this medicine. You may get drowsy or dizzy. Do not drive, use machinery, or do anything that needs mental alertness until you know how this medicine affects you. Do not stand up or sit up quickly, especially if you are an older patient. Thisreduces the risk of dizzy or fainting spells. Be careful brushing or flossing your teeth or using a toothpick because you may get an infection or bleed more easily. If you have any dental work done, tellyour dentist you are receiving this medicine. This medicine may make it more difficult to get pregnant. Talk to your healthcare provider if you are concerned about your fertility. What side effects may I notice from receiving this medication? Side effects that you should report to your doctor or health care provider assoon as possible: allergic reactions (skin rash, itching or hives;  swelling of the face, lips, or tongue) bleeding (bloody or black, tarry stools; red or dark brown urine; spitting up blood or brown material that looks like coffee grounds; red spots on the skin; unusual bruising or bleeding from the eyes, gums, or nose) blood clot (chest pain; shortness of breath; pain, swelling, or warmth in the leg) general ill feeling or flu-like symptoms high potassium levels (chest pain; fast, irregular heartbeat; muscle weakness) kidney injury (trouble passing urine or change in the amount of urine) light-colored stool liver injury (dark yellow or brown urine; general ill feeling or flu-like symptoms; loss of appetite, right upper belly pain; unusually weak or tired, yellowing of the eyes or skin) low red blood cell counts (trouble breathing; feeling faint; lightheaded, falls; unusually weak or tired) rash, fever, and swollen lymph nodes redness, blistering, peeling, or loosening of the skin, including inside the mouth stroke (changes in vision; confusion; trouble speaking or understanding; severe headaches; sudden numbness or weakness of the face, arm or leg; trouble walking; dizziness; loss of balance or coordination) Side effects that usually do not require medical attention (report to yourdoctor or health care provider if they continue or are bothersome): constipation diarrhea dizziness gas headache heartburn nausea, vomiting This list may not describe all possible side effects. Call your doctor for medical advice about side effects. You may report side effects to FDA at1-800-FDA-1088. Where should I keep my medication? Keep out of the reach of   children and pets. Store at room temperature between 20 and 25 degrees C (68 and 77 degrees F). Keep this drug in the original container. Protect from moisture. Keep thecontainer tightly closed. Get rid of any unused medicine after the expiration date. To get rid of medicines that are no longer needed or have expired: Take  the medicine to a medicine take-back program. Check with your pharmacy or law enforcement to find a location. If you cannot return the medicine, check the label or package insert to see if the medicine should be thrown out in the garbage or flushed down the toilet. If you are not sure, ask your health care provider. If it is safe to put it in the trash, empty the medicine out of the container. Mix the medicine with cat litter, dirt, coffee grounds, or other unwanted substance. Seal the mixture in a bag or container. Put it in the trash. NOTE: This sheet is a summary. It may not cover all possible information. If you have questions about this medicine, talk to your doctor, pharmacist, orhealth care provider.  2022 Elsevier/Gold Standard (2019-12-09 15:21:19)  

## 2020-07-28 NOTE — Progress Notes (Deleted)
  PROCEDURE RECORD  Physical Medicine and Rehabilitation   Name: Paul [name] DOB:1970-03-18 MRN: [id number]  Date:07/28/2020  Physician: Claudette Laws, MD    Nurse/CMA: Nedra Hai CMA  Allergies: No Known Allergies  Consent Signed: Yes.    Is patient diabetic? No.  CBG today?   Pregnant: No. LMP: No LMP for male patient. (age 51-55)  Anticoagulants: no Anti-inflammatory: no Antibiotics: no  Procedure: right lumbar 3-4-5 medial branch blocks Position: Prone Start Time:   End Time:   Fluoro Time:   RN/CMA Jannae Fagerstrom RN Lee CMA    Time 2:43     BP 154/84     Pulse 71     Respirations 14     O2 Sat 95     S/S 6     Pain Level 3 or less      D/C home , patient A & O X 3, D/C instructions reviewed, and sits independently.

## 2020-08-06 ENCOUNTER — Other Ambulatory Visit (INDEPENDENT_AMBULATORY_CARE_PROVIDER_SITE_OTHER): Payer: Self-pay | Admitting: Nurse Practitioner

## 2020-08-06 DIAGNOSIS — I10 Essential (primary) hypertension: Secondary | ICD-10-CM

## 2020-08-26 ENCOUNTER — Ambulatory Visit (INDEPENDENT_AMBULATORY_CARE_PROVIDER_SITE_OTHER): Payer: Medicaid Other | Admitting: Internal Medicine

## 2020-09-21 ENCOUNTER — Ambulatory Visit: Payer: Medicaid Other | Admitting: Gastroenterology

## 2020-09-21 ENCOUNTER — Encounter: Payer: Self-pay | Admitting: Gastroenterology

## 2020-11-03 ENCOUNTER — Other Ambulatory Visit (INDEPENDENT_AMBULATORY_CARE_PROVIDER_SITE_OTHER): Payer: Self-pay | Admitting: Nurse Practitioner

## 2020-11-03 DIAGNOSIS — I10 Essential (primary) hypertension: Secondary | ICD-10-CM

## 2020-11-26 ENCOUNTER — Ambulatory Visit (INDEPENDENT_AMBULATORY_CARE_PROVIDER_SITE_OTHER): Payer: Medicaid Other | Admitting: Internal Medicine

## 2020-11-26 ENCOUNTER — Other Ambulatory Visit: Payer: Self-pay

## 2020-11-26 ENCOUNTER — Encounter: Payer: Self-pay | Admitting: Internal Medicine

## 2020-11-26 VITALS — BP 142/89 | HR 78 | Temp 97.8°F | Ht 71.02 in | Wt 284.4 lb

## 2020-11-26 DIAGNOSIS — Z1211 Encounter for screening for malignant neoplasm of colon: Secondary | ICD-10-CM | POA: Diagnosis not present

## 2020-11-26 DIAGNOSIS — I10 Essential (primary) hypertension: Secondary | ICD-10-CM | POA: Diagnosis not present

## 2020-11-26 DIAGNOSIS — N529 Male erectile dysfunction, unspecified: Secondary | ICD-10-CM

## 2020-11-26 LAB — URINALYSIS, ROUTINE W REFLEX MICROSCOPIC
Bilirubin, UA: NEGATIVE
Glucose, UA: NEGATIVE
Ketones, UA: NEGATIVE
Leukocytes,UA: NEGATIVE
Nitrite, UA: NEGATIVE
Protein,UA: NEGATIVE
Specific Gravity, UA: 1.02 (ref 1.005–1.030)
Urobilinogen, Ur: 1 mg/dL (ref 0.2–1.0)
pH, UA: 6 (ref 5.0–7.5)

## 2020-11-26 LAB — MICROSCOPIC EXAMINATION
Bacteria, UA: NONE SEEN
Epithelial Cells (non renal): NONE SEEN /hpf (ref 0–10)
WBC, UA: NONE SEEN /hpf (ref 0–5)

## 2020-11-26 MED ORDER — LISINOPRIL-HYDROCHLOROTHIAZIDE 10-12.5 MG PO TABS: 1.0000 | ORAL_TABLET | Freq: Every day | ORAL | 3 refills | Status: DC

## 2020-11-26 NOTE — Progress Notes (Signed)
BP (!) 142/89   Pulse 78   Temp 97.8 F (36.6 C) (Oral)   Ht 5' 11.02" (1.804 m)   Wt 284 lb 6.4 oz (129 kg)   SpO2 97%   BMI 39.64 kg/m    Subjective:    Patient ID: Paul [name], male    DOB: 01-02-1971, 50 y.o.   MRN: [id number]  Chief Complaint  Patient presents with  . New Patient (Initial Visit)    HPI: Paul [name] is a 50 y.o. male  Pt is new ot the practice - lives in [place] was seeing someone there who passed away x 2 months ago Hasnt been on meds x 1 month ago now.   Lost about 20 lbs fasts in the morning till 10: 30 - 11 am, portion control. Helped with weight loss , exercise.  Moved here from [place].   Hypertension This is a chronic (142/89 mm hg is on lisinopri/ hctz) problem. The current episode started more than 1 year ago. The problem has been waxing and waning since onset. The problem is controlled. Pertinent negatives include no anxiety, blurred vision, chest pain, headaches, malaise/fatigue, neck pain, orthopnea, palpitations, peripheral edema, PND or shortness of breath.   Chief Complaint  Patient presents with  . New Patient (Initial Visit)    Relevant past medical, surgical, family and social history reviewed and updated as indicated. Interim medical history since our last visit reviewed. Allergies and medications reviewed and updated.  Review of Systems  Constitutional:  Negative for malaise/fatigue.  Eyes:  Negative for blurred vision.  Respiratory:  Negative for shortness of breath.   Cardiovascular:  Negative for chest pain, palpitations, orthopnea and PND.  Musculoskeletal:  Negative for neck pain.  Neurological:  Negative for headaches.   Per HPI unless specifically indicated above     Objective:    BP (!) 142/89   Pulse 78   Temp 97.8 F (36.6 C) (Oral)   Ht 5' 11.02" (1.804 m)   Wt 284 lb 6.4 oz (129 kg)   SpO2 97%   BMI 39.64 kg/m   Wt Readings from Last 3 Encounters:  11/26/20 284 lb 6.4 oz (129 kg)  07/28/20  280 lb 3.2 oz (127.1 kg)  06/12/20 277 lb 3.2 oz (125.7 kg)    Physical Exam Vitals and nursing note reviewed.  Constitutional:      General: He is not in acute distress.    Appearance: Normal appearance. He is not ill-appearing or diaphoretic.  HENT:     Head: Normocephalic and atraumatic.     Right Ear: Tympanic membrane and external ear normal. There is no impacted cerumen.     Left Ear: External ear normal.     Nose: No congestion or rhinorrhea.     Mouth/Throat:     Pharynx: No oropharyngeal exudate or posterior oropharyngeal erythema.  Eyes:     Conjunctiva/sclera: Conjunctivae normal.     Pupils: Pupils are equal, round, and reactive to light.  Cardiovascular:     Rate and Rhythm: Normal rate and regular rhythm.     Heart sounds: No murmur heard.   No friction rub. No gallop.  Pulmonary:     Effort: No respiratory distress.     Breath sounds: No stridor. No wheezing or rhonchi.  Chest:     Chest wall: No tenderness.  Abdominal:     General: Abdomen is flat. Bowel sounds are normal.     Palpations: Abdomen is soft. There is no mass.  Tenderness: There is no abdominal tenderness.  Musculoskeletal:     Cervical back: Normal range of motion and neck supple. No rigidity or tenderness.     Left lower leg: No edema.  Skin:    General: Skin is warm and dry.  Neurological:     Mental Status: He is alert.    Results for orders placed or performed in visit on 02/19/20  COMPLETE METABOLIC PANEL WITH GFR  Result Value Ref Range   Glucose, Bld 104 65 - 139 mg/dL   BUN 11 7 - 25 mg/dL   Creat 0.53 9.76 - 7.34 mg/dL   GFR, Est Non African American 77 > OR = 60 mL/min/1.67m2   GFR, Est African American 90 > OR = 60 mL/min/1.67m2   BUN/Creatinine Ratio NOT APPLICABLE 6 - 22 (calc)   Sodium 139 135 - 146 mmol/L   Potassium 4.9 3.5 - 5.3 mmol/L   Chloride 100 98 - 110 mmol/L   CO2 29 20 - 32 mmol/L   Calcium 9.5 8.6 - 10.3 mg/dL   Total Protein 7.3 6.1 - 8.1 g/dL    Albumin 4.3 3.6 - 5.1 g/dL   Globulin 3.0 1.9 - 3.7 g/dL (calc)   AG Ratio 1.4 1.0 - 2.5 (calc)   Total Bilirubin 0.5 0.2 - 1.2 mg/dL   Alkaline phosphatase (APISO) 68 35 - 144 U/L   AST 23 10 - 35 U/L   ALT 33 9 - 46 U/L  VITAMIN D 25 Hydroxy (Vit-D Deficiency, Fractures)  Result Value Ref Range   Vit D, 25-Hydroxy 52 30 - 100 ng/mL        Current Outpatient Medications:  .  Cholecalciferol 1.25 MG (50000 UT) TABS, Take 2 tablets by mouth daily., Disp: , Rfl:  .  meloxicam (MOBIC) 15 MG tablet, Take 1 tablet (15 mg total) by mouth daily., Disp: 30 tablet, Rfl: 5 .  MULTIPLE VITAMIN PO, Take by mouth. Takes 2 gummy vitamins a day, Disp: , Rfl:  .  Testosterone 20 % CREA, Apply 100 mg topically 2 (two) times daily., Disp: 30 g, Rfl: 2 .  lisinopril-hydrochlorothiazide (ZESTORETIC) 10-12.5 MG tablet, Take 1 tablet by mouth daily., Disp: 30 tablet, Rfl: 3    Assessment & Plan:  Obesity : Body mass index is 39.64 kg/m. Lifestyle modifications advised to pt.  Portion control and avoiding high carb low fat diet advised.  Diet plan given to pt   exercise plan given and encouraged.  To increase exercise to 150 mins a week ie 21/2 hours a week. Pt verbalises understanding of the above.    2. HTN Is on lisinopril/ hctz  Continue current meds.  Medication compliance emphasised. pt advised to keep Bp logs. Pt verbalised understanding of the same. Pt to have a low salt diet . Exercise to reach a goal of at least 150 mins a week.  lifestyle modifications explained and pt understands importance of the above.  3. Erectile dysfunction was on testosterone for such, didn't help much.  Will need to refer to urology.     Problem List Items Addressed This Visit       Cardiovascular and Mediastinum   Essential hypertension, benign   Relevant Medications   lisinopril-hydrochlorothiazide (ZESTORETIC) 10-12.5 MG tablet   Other Relevant Orders   CBC with Differential/Platelet   Comprehensive  metabolic panel   Lipid panel   Urinalysis, Routine w reflex microscopic   TSH   Ambulatory referral to Urology   Other Visit Diagnoses  Colon cancer screening    -  Primary   Relevant Orders   Cologuard   Erectile dysfunction, unspecified erectile dysfunction type       Relevant Orders   Ambulatory referral to Urology        Orders Placed This Encounter  Procedures  . Cologuard  . CBC with Differential/Platelet  . Comprehensive metabolic panel  . Lipid panel  . Urinalysis, Routine w reflex microscopic  . TSH  . Ambulatory referral to Urology     Meds ordered this encounter  Medications  . lisinopril-hydrochlorothiazide (ZESTORETIC) 10-12.5 MG tablet    Sig: Take 1 tablet by mouth daily.    Dispense:  30 tablet    Refill:  3     Follow up plan: No follow-ups on file.  Health Maintenance : Cscope :ordered

## 2020-11-27 LAB — COMPREHENSIVE METABOLIC PANEL
ALT: 44 IU/L (ref 0–44)
AST: 24 IU/L (ref 0–40)
Albumin/Globulin Ratio: 1.6 (ref 1.2–2.2)
Albumin: 4.5 g/dL (ref 3.8–4.9)
Alkaline Phosphatase: 74 IU/L (ref 44–121)
BUN/Creatinine Ratio: 14 (ref 9–20)
BUN: 13 mg/dL (ref 6–24)
Bilirubin Total: 0.6 mg/dL (ref 0.0–1.2)
CO2: 24 mmol/L (ref 20–29)
Calcium: 9.2 mg/dL (ref 8.7–10.2)
Chloride: 100 mmol/L (ref 96–106)
Creatinine, Ser: 0.92 mg/dL (ref 0.76–1.27)
Globulin, Total: 2.8 g/dL (ref 1.5–4.5)
Glucose: 82 mg/dL (ref 70–99)
Potassium: 4.2 mmol/L (ref 3.5–5.2)
Sodium: 141 mmol/L (ref 134–144)
Total Protein: 7.3 g/dL (ref 6.0–8.5)
eGFR: 101 mL/min/{1.73_m2} (ref >59)

## 2020-11-27 LAB — CBC WITH DIFFERENTIAL/PLATELET
Basophils Absolute: 0 10*3/uL (ref 0.0–0.2)
Basos: 1 %
EOS (ABSOLUTE): 0.1 10*3/uL (ref 0.0–0.4)
Eos: 2 %
Hematocrit: 54.6 % — ABNORMAL HIGH (ref 37.5–51.0)
Hemoglobin: 18 g/dL — ABNORMAL HIGH (ref 13.0–17.7)
Immature Grans (Abs): 0 10*3/uL (ref 0.0–0.1)
Immature Granulocytes: 0 %
Lymphocytes Absolute: 1.6 10*3/uL (ref 0.7–3.1)
Lymphs: 33 %
MCH: 29.6 pg (ref 26.6–33.0)
MCHC: 33 g/dL (ref 31.5–35.7)
MCV: 90 fL (ref 79–97)
Monocytes Absolute: 0.5 10*3/uL (ref 0.1–0.9)
Monocytes: 10 %
Neutrophils Absolute: 2.7 10*3/uL (ref 1.4–7.0)
Neutrophils: 54 %
Platelets: 153 10*3/uL (ref 150–450)
RBC: 6.09 x10E6/uL — ABNORMAL HIGH (ref 4.14–5.80)
RDW: 12.8 % (ref 11.6–15.4)
WBC: 5 10*3/uL (ref 3.4–10.8)

## 2020-11-27 LAB — TSH: TSH: 1.97 u[IU]/mL (ref 0.450–4.500)

## 2020-11-30 ENCOUNTER — Encounter: Payer: Self-pay | Admitting: *Deleted

## 2020-12-11 ENCOUNTER — Other Ambulatory Visit: Payer: Self-pay

## 2020-12-11 ENCOUNTER — Ambulatory Visit (INDEPENDENT_AMBULATORY_CARE_PROVIDER_SITE_OTHER): Payer: Medicaid Other | Admitting: Internal Medicine

## 2020-12-11 ENCOUNTER — Encounter: Payer: Self-pay | Admitting: Internal Medicine

## 2020-12-11 VITALS — BP 140/78 | HR 78 | Temp 98.0°F | Ht 70.87 in | Wt 284.0 lb

## 2020-12-11 DIAGNOSIS — N529 Male erectile dysfunction, unspecified: Secondary | ICD-10-CM | POA: Insufficient documentation

## 2020-12-11 DIAGNOSIS — Z13220 Encounter for screening for lipoid disorders: Secondary | ICD-10-CM | POA: Insufficient documentation

## 2020-12-11 DIAGNOSIS — I10 Essential (primary) hypertension: Secondary | ICD-10-CM

## 2020-12-11 HISTORY — DX: Essential (primary) hypertension: I10

## 2020-12-11 MED ORDER — LISINOPRIL-HYDROCHLOROTHIAZIDE 10-12.5 MG PO TABS: 2.0000 | ORAL_TABLET | Freq: Every day | ORAL | 3 refills | Status: DC

## 2020-12-11 NOTE — Progress Notes (Signed)
BP 140/78   Pulse 78   Temp 98 F (36.7 C) (Oral)   Ht 5' 10.87" (1.8 m)   Wt 284 lb (128.8 kg)   SpO2 98%   BMI 39.76 kg/m    Subjective:    Patient ID: Paul [name], male    DOB: May 28, 1970, 50 y.o.   MRN: [id number]  Chief Complaint  Patient presents with   Hypertension    HPI: Paul [name] is a 50 y.o. male  Hypertension Pertinent negatives include no chest pain, headaches, palpitations or shortness of breath.   Chief Complaint  Patient presents with   Hypertension    Relevant past medical, surgical, family and social history reviewed and updated as indicated. Interim medical history since our last visit reviewed. Allergies and medications reviewed and updated.  Review of Systems  Constitutional:  Negative for activity change, appetite change, chills, fatigue and fever.  HENT:  Negative for congestion, ear discharge, ear pain and facial swelling.   Eyes:  Negative for pain, discharge and itching.  Respiratory:  Negative for cough, chest tightness, shortness of breath and wheezing.   Cardiovascular:  Negative for chest pain, palpitations and leg swelling.  Gastrointestinal:  Negative for abdominal distention, abdominal pain, blood in stool, constipation, diarrhea, nausea and vomiting.  Endocrine: Negative for cold intolerance, heat intolerance, polydipsia, polyphagia and polyuria.  Genitourinary:  Negative for difficulty urinating, dysuria, flank pain, frequency, hematuria and urgency.  Musculoskeletal:  Negative for arthralgias, gait problem, joint swelling and myalgias.  Skin:  Negative for color change, rash and wound.  Neurological:  Negative for dizziness, tremors, speech difficulty, weakness, light-headedness, numbness and headaches.  Hematological:  Does not bruise/bleed easily.  Psychiatric/Behavioral:  Negative for agitation, confusion, decreased concentration, sleep disturbance and suicidal ideas.    Per HPI unless specifically indicated above      Objective:    BP 140/78   Pulse 78   Temp 98 F (36.7 C) (Oral)   Ht 5' 10.87" (1.8 m)   Wt 284 lb (128.8 kg)   SpO2 98%   BMI 39.76 kg/m   Wt Readings from Last 3 Encounters:  12/11/20 284 lb (128.8 kg)  11/26/20 284 lb 6.4 oz (129 kg)  07/28/20 280 lb 3.2 oz (127.1 kg)    Physical Exam Vitals and nursing note reviewed.  Constitutional:      General: He is not in acute distress.    Appearance: Normal appearance. He is not ill-appearing or diaphoretic.  HENT:     Head: Normocephalic and atraumatic.     Right Ear: Tympanic membrane and external ear normal. There is no impacted cerumen.     Left Ear: External ear normal.     Nose: No congestion or rhinorrhea.     Mouth/Throat:     Pharynx: No oropharyngeal exudate or posterior oropharyngeal erythema.  Eyes:     Conjunctiva/sclera: Conjunctivae normal.     Pupils: Pupils are equal, round, and reactive to light.  Cardiovascular:     Rate and Rhythm: Normal rate and regular rhythm.     Heart sounds: No murmur heard.   No friction rub. No gallop.  Pulmonary:     Effort: No respiratory distress.     Breath sounds: No stridor. No wheezing or rhonchi.  Chest:     Chest wall: No tenderness.  Abdominal:     Tenderness: There is no abdominal tenderness.  Musculoskeletal:     Cervical back: Normal range of motion and neck supple. No rigidity or tenderness.  Left lower leg: No edema.  Skin:    General: Skin is warm and dry.  Neurological:     Mental Status: He is alert.    Results for orders placed or performed in visit on 11/26/20  Microscopic Examination   Urine  Result Value Ref Range   WBC, UA None seen 0 - 5 /hpf   RBC 0-2 0 - 2 /hpf   Epithelial Cells (non renal) None seen 0 - 10 /hpf   Bacteria, UA None seen None seen/Few  CBC with Differential/Platelet  Result Value Ref Range   WBC 5.0 3.4 - 10.8 x10E3/uL   RBC 6.09 (H) 4.14 - 5.80 x10E6/uL   Hemoglobin 18.0 (H) 13.0 - 17.7 g/dL   Hematocrit 54.6 (H)  37.5 - 51.0 %   MCV 90 79 - 97 fL   MCH 29.6 26.6 - 33.0 pg   MCHC 33.0 31.5 - 35.7 g/dL   RDW 12.8 11.6 - 15.4 %   Platelets 153 150 - 450 x10E3/uL   Neutrophils 54 Not Estab. %   Lymphs 33 Not Estab. %   Monocytes 10 Not Estab. %   Eos 2 Not Estab. %   Basos 1 Not Estab. %   Neutrophils Absolute 2.7 1.4 - 7.0 x10E3/uL   Lymphocytes Absolute 1.6 0.7 - 3.1 x10E3/uL   Monocytes Absolute 0.5 0.1 - 0.9 x10E3/uL   EOS (ABSOLUTE) 0.1 0.0 - 0.4 x10E3/uL   Basophils Absolute 0.0 0.0 - 0.2 x10E3/uL   Immature Granulocytes 0 Not Estab. %   Immature Grans (Abs) 0.0 0.0 - 0.1 x10E3/uL  Comprehensive metabolic panel  Result Value Ref Range   Glucose 82 70 - 99 mg/dL   BUN 13 6 - 24 mg/dL   Creatinine, Ser 0.92 0.76 - 1.27 mg/dL   eGFR 101 >59 mL/min/1.73   BUN/Creatinine Ratio 14 9 - 20   Sodium 141 134 - 144 mmol/L   Potassium 4.2 3.5 - 5.2 mmol/L   Chloride 100 96 - 106 mmol/L   CO2 24 20 - 29 mmol/L   Calcium 9.2 8.7 - 10.2 mg/dL   Total Protein 7.3 6.0 - 8.5 g/dL   Albumin 4.5 3.8 - 4.9 g/dL   Globulin, Total 2.8 1.5 - 4.5 g/dL   Albumin/Globulin Ratio 1.6 1.2 - 2.2   Bilirubin Total 0.6 0.0 - 1.2 mg/dL   Alkaline Phosphatase 74 44 - 121 IU/L   AST 24 0 - 40 IU/L   ALT 44 0 - 44 IU/L  Urinalysis, Routine w reflex microscopic  Result Value Ref Range   Specific Gravity, UA 1.020 1.005 - 1.030   pH, UA 6.0 5.0 - 7.5   Color, UA Yellow Yellow   Appearance Ur Clear Clear   Leukocytes,UA Negative Negative   Protein,UA Negative Negative/Trace   Glucose, UA Negative Negative   Ketones, UA Negative Negative   RBC, UA Trace (A) Negative   Bilirubin, UA Negative Negative   Urobilinogen, Ur 1.0 0.2 - 1.0 mg/dL   Nitrite, UA Negative Negative   Microscopic Examination See below:   TSH  Result Value Ref Range   TSH 1.970 0.450 - 4.500 uIU/mL        Current Outpatient Medications:    Cholecalciferol 1.25 MG (50000 UT) TABS, Take 2 tablets by mouth daily., Disp: , Rfl:     meloxicam (MOBIC) 15 MG tablet, Take 1 tablet (15 mg total) by mouth daily., Disp: 30 tablet, Rfl: 5   MULTIPLE VITAMIN PO, Take by mouth. Takes  2 gummy vitamins a day, Disp: , Rfl:    Testosterone 20 % CREA, Apply 100 mg topically 2 (two) times daily., Disp: 30 g, Rfl: 2   lisinopril-hydrochlorothiazide (ZESTORETIC) 10-12.5 MG tablet, Take 2 tablets by mouth daily., Disp: 60 tablet, Rfl: 3    Assessment & Plan:   Htn :  Is on 10 - 12.5 mg increase dose to 2 tabs daily, exercise 20 mins every other day cut back on salt and red meat.   Medication compliance emphasised. pt advised to keep Bp logs. Pt verbalised understanding of the same. Pt to have a low salt diet . Exercise to reach a goal of at least 150 mins a week.  lifestyle modifications explained and pt understands importance of the above.    Vitals with BMI 12/11/2020 11/26/2020 11/26/2020  Weight 284 lbs  284 lbs 6 oz  BMI 39.76  39.64   Obesity work on diet and exercise as above    Problem List Items Addressed This Visit       Cardiovascular and Mediastinum   Essential hypertension, benign   Relevant Medications   lisinopril-hydrochlorothiazide (ZESTORETIC) 10-12.5 MG tablet   Primary hypertension - Primary   Relevant Medications   lisinopril-hydrochlorothiazide (ZESTORETIC) 10-12.5 MG tablet     Other   Screening for lipid disorders   Relevant Orders   Lipid panel     Orders Placed This Encounter  Procedures   Lipid panel     Meds ordered this encounter  Medications   lisinopril-hydrochlorothiazide (ZESTORETIC) 10-12.5 MG tablet    Sig: Take 2 tablets by mouth daily.    Dispense:  60 tablet    Refill:  3     Follow up plan: Return in about 2 weeks (around 12/25/2020).

## 2020-12-11 NOTE — Patient Instructions (Addendum)
High Cholesterol High cholesterol is a condition in which the blood has high levels of a white, waxy substance similar to fat (cholesterol). The liver makes all the cholesterol that the body needs. The human body needs small amounts of cholesterol to help build cells. A person gets extra or excess cholesterol from the food that he or she eats. The blood carries cholesterol from the liver to the rest of the body. If you have high cholesterol, deposits (plaques) may build up on the walls of your arteries. Arteries are the blood vessels that carry blood away from your heart. These plaques make the arteries narrow and stiff. Cholesterol plaques increase your risk for heart attack and stroke. Work with your health care provider to keep your cholesterol levels in a healthy range. What increases the risk? The following factors may make you more likely to develop this condition: Eating foods that are high in animal fat (saturated fat) or cholesterol. Being overweight. Not getting enough exercise. A family history of high cholesterol (familial hypercholesterolemia). Use of tobacco products. Having diabetes. What are the signs or symptoms? In most cases, high cholesterol does not usually cause any symptoms. In severe cases, very high cholesterol levels can cause: Fatty bumps under the skin (xanthomas). A white or gray ring around the black center (pupil) of the eye. How is this diagnosed? This condition may be diagnosed based on the results of a blood test. If you are older than 51 years of age, your health care provider may check your cholesterol levels every 4-6 years. You may be checked more often if you have high cholesterol or other risk factors for heart disease. The blood test for cholesterol measures: "Bad" cholesterol, or LDL cholesterol. This is the main type of cholesterol that causes heart disease. The desired level is less than 100 mg/dL (2.59 mmol/L). "Good" cholesterol, or HDL  cholesterol. HDL helps protect against heart disease by cleaning the arteries and carrying the LDL to the liver for processing. The desired level for HDL is 60 mg/dL (1.55 mmol/L) or higher. Triglycerides. These are fats that your body can store or burn for energy. The desired level is less than 150 mg/dL (1.69 mmol/L). Total cholesterol. This measures the total amount of cholesterol in your blood and includes LDL, HDL, and triglycerides. The desired level is less than 200 mg/dL (5.17 mmol/L). How is this treated? Treatment for high cholesterol starts with lifestyle changes, such as diet and exercise. Diet changes. You may be asked to eat foods that have more fiber and less saturated fats or added sugar. Lifestyle changes. These may include regular exercise, maintaining a healthy weight, and quitting use of tobacco products. Medicines. These are given when diet and lifestyle changes have not worked. You may be prescribed a statin medicine to help lower your cholesterol levels. Follow these instructions at home: Eating and drinking  Eat a healthy, balanced diet. This diet includes: Daily servings of a variety of fresh, frozen, or canned fruits and vegetables. Daily servings of whole grain foods that are rich in fiber. Foods that are low in saturated fats and trans fats. These include poultry and fish without skin, lean cuts of meat, and low-fat dairy products. A variety of fish, especially oily fish that contain omega-3 fatty acids. Aim to eat fish at least 2 times a week. Avoid foods and drinks that have added sugar. Use healthy cooking methods, such as roasting, grilling, broiling, baking, poaching, steaming, and stir-frying. Do not fry your food except for stir-frying.   If you drink alcohol: Limit how much you have to: 0-1 drink a day for women who are not pregnant. 0-2 drinks a day for men. Know how much alcohol is in a drink. In the [place] one drink equals one 12 oz bottle of beer (355 mL),  one 5 oz glass of wine (148 mL), or one 1 oz glass of hard liquor (44 mL). Lifestyle  Get regular exercise. Aim to exercise for a total of 150 minutes a week. Increase your activity level by doing activities such as gardening, walking, and taking the stairs. Do not use any products that contain nicotine or tobacco. These products include cigarettes, chewing tobacco, and vaping devices, such as e-cigarettes. If you need help quitting, ask your health care provider. General instructions Take over-the-counter and prescription medicines only as told by your health care provider. Keep all follow-up visits. This is important. Where to find more information American Heart Association: www.heart.org National Heart, Lung, and Blood Institute: www.nhlbi.nih.gov Contact a health care provider if: You have trouble achieving or maintaining a healthy diet or weight. You are starting an exercise program. You are unable to stop smoking. Get help right away if: You have chest pain. You have trouble breathing. You have discomfort or pain in your jaw, neck, back, shoulder, or arm. You have any symptoms of a stroke. "BE FAST" is an easy way to remember the main warning signs of a stroke: B - Balance. Signs are dizziness, sudden trouble walking, or loss of balance. E - Eyes. Signs are trouble seeing or a sudden change in vision. F - Face. Signs are sudden weakness or numbness of the face, or the face or eyelid drooping on one side. A - Arms. Signs are weakness or numbness in an arm. This happens suddenly and usually on one side of the body. S - Speech. Signs are sudden trouble speaking, slurred speech, or trouble understanding what people say. T - Time. Time to call emergency services. Write down what time symptoms started. You have other signs of a stroke, such as: A sudden, severe headache with no known cause. Nausea or vomiting. Seizure. These symptoms may represent a serious problem that is an  emergency. Do not wait to see if the symptoms will go away. Get medical help right away. Call your local emergency services (911 in the [place] Do not drive yourself to the hospital. Summary Cholesterol plaques increase your risk for heart attack and stroke. Work with your health care provider to keep your cholesterol levels in a healthy range. Eat a healthy, balanced diet, get regular exercise, and maintain a healthy weight. Do not use any products that contain nicotine or tobacco. These products include cigarettes, chewing tobacco, and vaping devices, such as e-cigarettes. Get help right away if you have any symptoms of a stroke. This information is not intended to replace advice given to you by your health care provider. Make sure you discuss any questions you have with your health care provider. Document Revised: 03/12/2020 Document Reviewed: 03/02/2020 Elsevier Patient Education  2022 Elsevier Inc. Hypertension, Adult High blood pressure (hypertension) is when the force of blood pumping through the arteries is too strong. The arteries are the blood vessels that carry blood from the heart throughout the body. Hypertension forces the heart to work harder to pump blood and may cause arteries to become narrow or stiff. Untreated or uncontrolled hypertension can cause a heart attack, heart failure, a stroke, kidney disease, and other problems. A blood pressure reading consists   of a higher number over a lower number. Ideally, your blood pressure should be below 120/80. The first ("top") number is called the systolic pressure. It is a measure of the pressure in your arteries as your heart beats. The second ("bottom") number is called the diastolic pressure. It is a measure of the pressure in your arteries as the heart relaxes. What are the causes? The exact cause of this condition is not known. There are some conditions that result in or are related to high blood pressure. What increases the risk? Some  risk factors for high blood pressure are under your control. The following factors may make you more likely to develop this condition: Smoking. Having type 2 diabetes mellitus, high cholesterol, or both. Not getting enough exercise or physical activity. Being overweight. Having too much fat, sugar, calories, or salt (sodium) in your diet. Drinking too much alcohol. Some risk factors for high blood pressure may be difficult or impossible to change. Some of these factors include: Having chronic kidney disease. Having a family history of high blood pressure. Age. Risk increases with age. Race. You may be at higher risk if you are African American. Gender. Men are at higher risk than women before age 45. After age 65, women are at higher risk than men. Having obstructive sleep apnea. Stress. What are the signs or symptoms? High blood pressure may not cause symptoms. Very high blood pressure (hypertensive crisis) may cause: Headache. Anxiety. Shortness of breath. Nosebleed. Nausea and vomiting. Vision changes. Severe chest pain. Seizures. How is this diagnosed? This condition is diagnosed by measuring your blood pressure while you are seated, with your arm resting on a flat surface, your legs uncrossed, and your feet flat on the floor. The cuff of the blood pressure monitor will be placed directly against the skin of your upper arm at the level of your heart. It should be measured at least twice using the same arm. Certain conditions can cause a difference in blood pressure between your right and left arms. Certain factors can cause blood pressure readings to be lower or higher than normal for a short period of time: When your blood pressure is higher when you are in a health care provider's office than when you are at home, this is called white coat hypertension. Most people with this condition do not need medicines. When your blood pressure is higher at home than when you are in a health  care provider's office, this is called masked hypertension. Most people with this condition may need medicines to control blood pressure. If you have a high blood pressure reading during one visit or you have normal blood pressure with other risk factors, you may be asked to: Return on a different day to have your blood pressure checked again. Monitor your blood pressure at home for 1 week or longer. If you are diagnosed with hypertension, you may have other blood or imaging tests to help your health care provider understand your overall risk for other conditions. How is this treated? This condition is treated by making healthy lifestyle changes, such as eating healthy foods, exercising more, and reducing your alcohol intake. Your health care provider may prescribe medicine if lifestyle changes are not enough to get your blood pressure under control, and if: Your systolic blood pressure is above 130. Your diastolic blood pressure is above 80. Your personal target blood pressure may vary depending on your medical conditions, your age, and other factors. Follow these instructions at home: Eating and   drinking  Eat a diet that is high in fiber and potassium, and low in sodium, added sugar, and fat. An example eating plan is called the DASH (Dietary Approaches to Stop Hypertension) diet. To eat this way: Eat plenty of fresh fruits and vegetables. Try to fill one half of your plate at each meal with fruits and vegetables. Eat whole grains, such as whole-wheat pasta, brown rice, or whole-grain bread. Fill about one fourth of your plate with whole grains. Eat or drink low-fat dairy products, such as skim milk or low-fat yogurt. Avoid fatty cuts of meat, processed or cured meats, and poultry with skin. Fill about one fourth of your plate with lean proteins, such as fish, chicken without skin, beans, eggs, or tofu. Avoid pre-made and processed foods. These tend to be higher in sodium, added sugar, and  fat. Reduce your daily sodium intake. Most people with hypertension should eat less than 1,500 mg of sodium a day. Do not drink alcohol if: Your health care provider tells you not to drink. You are pregnant, may be pregnant, or are planning to become pregnant. If you drink alcohol: Limit how much you use to: 0-1 drink a day for women. 0-2 drinks a day for men. Be aware of how much alcohol is in your drink. In the [place] one drink equals one 12 oz bottle of beer (355 mL), one 5 oz glass of wine (148 mL), or one 1 oz glass of hard liquor (44 mL). Lifestyle  Work with your health care provider to maintain a healthy body weight or to lose weight. Ask what an ideal weight is for you. Get at least 30 minutes of exercise most days of the week. Activities may include walking, swimming, or biking. Include exercise to strengthen your muscles (resistance exercise), such as Pilates or lifting weights, as part of your weekly exercise routine. Try to do these types of exercises for 30 minutes at least 3 days a week. Do not use any products that contain nicotine or tobacco, such as cigarettes, e-cigarettes, and chewing tobacco. If you need help quitting, ask your health care provider. Monitor your blood pressure at home as told by your health care provider. Keep all follow-up visits as told by your health care provider. This is important. Medicines Take over-the-counter and prescription medicines only as told by your health care provider. Follow directions carefully. Blood pressure medicines must be taken as prescribed. Do not skip doses of blood pressure medicine. Doing this puts you at risk for problems and can make the medicine less effective. Ask your health care provider about side effects or reactions to medicines that you should watch for. Contact a health care provider if you: Think you are having a reaction to a medicine you are taking. Have headaches that keep coming back (recurring). Feel  dizzy. Have swelling in your ankles. Have trouble with your vision. Get help right away if you: Develop a severe headache or confusion. Have unusual weakness or numbness. Feel faint. Have severe pain in your chest or abdomen. Vomit repeatedly. Have trouble breathing. Summary Hypertension is when the force of blood pumping through your arteries is too strong. If this condition is not controlled, it may put you at risk for serious complications. Your personal target blood pressure may vary depending on your medical conditions, your age, and other factors. For most people, a normal blood pressure is less than 120/80. Hypertension is treated with lifestyle changes, medicines, or a combination of both. Lifestyle changes include losing   weight, eating a healthy, low-sodium diet, exercising more, and limiting alcohol. This information is not intended to replace advice given to you by your health care provider. Make sure you discuss any questions you have with your health care provider. Document Revised: 09/06/2017 Document Reviewed: 09/06/2017 Elsevier Patient Education  2022 Elsevier Inc.  

## 2020-12-12 LAB — LIPID PANEL
Chol/HDL Ratio: 4.2 ratio (ref 0.0–5.0)
Cholesterol, Total: 199 mg/dL (ref 100–199)
HDL: 47 mg/dL (ref >39)
LDL Chol Calc (NIH): 118 mg/dL — ABNORMAL HIGH (ref 0–99)
Triglycerides: 195 mg/dL — ABNORMAL HIGH (ref 0–149)
VLDL Cholesterol Cal: 34 mg/dL (ref 5–40)

## 2020-12-16 ENCOUNTER — Ambulatory Visit (INDEPENDENT_AMBULATORY_CARE_PROVIDER_SITE_OTHER): Payer: Medicaid Other | Admitting: Urology

## 2020-12-16 ENCOUNTER — Encounter: Payer: Self-pay | Admitting: Urology

## 2020-12-16 ENCOUNTER — Other Ambulatory Visit: Payer: Self-pay

## 2020-12-16 VITALS — BP 161/84 | HR 70 | Ht 70.0 in | Wt 284.0 lb

## 2020-12-16 DIAGNOSIS — E291 Testicular hypofunction: Secondary | ICD-10-CM | POA: Diagnosis not present

## 2020-12-16 DIAGNOSIS — N529 Male erectile dysfunction, unspecified: Secondary | ICD-10-CM | POA: Diagnosis not present

## 2020-12-16 MED ORDER — TADALAFIL 5 MG PO TABS: 5.0000 mg | ORAL_TABLET | Freq: Every day | ORAL | 11 refills | Status: DC

## 2020-12-16 NOTE — Progress Notes (Signed)
   12/16/20 8:52 AM   Paul [name] [birth year] [id number]  CC: ED, low testosterone, PSA screening  HPI: 50 year old male who reports at least a few years of trouble with erections.  He is able to achieve erections for intercourse sometimes.  He has been on testosterone replacement through his PCP, which he feels helps with his libido.  He has never tried medications for erections previously.  He denies gross hematuria or urinary symptoms.  PSA was normal at 1.2 in April 2021, I recommended ongoing screening with his testosterone replacement therapy.  PMH: Past Medical History:  Diagnosis Date   Hypertension    Vitamin D deficiency     Surgical History: Past Surgical History:  Procedure Laterality Date   APPENDECTOMY     HERNIA REPAIR       Family History: Family History  Problem Relation Age of Onset   Healthy Mother    Cancer Father    Lung cancer Father     Social History:  reports that he has never smoked. He has never used smokeless tobacco. He reports current alcohol use of about 18.0 standard drinks per week. He reports that he does not use drugs.  Physical Exam: BP (!) 161/84   Pulse 70   Ht 5\' 10"  (1.778 m)   Wt 284 lb (128.8 kg)   BMI 40.75 kg/m    Constitutional:  Alert and oriented, No acute distress. Cardiovascular: No clubbing, cyanosis, or edema. Respiratory: Normal respiratory effort, no increased work of breathing. GI: Abdomen is soft, nontender, nondistended, no abdominal masses  Laboratory Data: Reviewed, see HPI   Assessment & Plan:   50 year old male with low testosterone managed by PCP, and persistent erectile dysfunction.  We reviewed the risks and benefits of PDE 5 inhibitors, and I recommended a trial of Cialis 5 mg daily.  This can be increased as needed.  Risks and benefits discussed.  Continue testosterone management with PCP, and recommended following PSA and hematocrit per the AUA guidelines.  Trial of Cialis 5 mg daily for ED,  okay to increase as needed, or take as needed if going well He would like to follow-up via MyChart/phone call on effectiveness RTC 1 year, if doing well at that time Cialis can be filled by PCP    43, MD 12/16/2020  [facility] [address] [place], [place] [phone]

## 2020-12-16 NOTE — Patient Instructions (Signed)
Tadalafil Tablets (Erectile Dysfunction, BPH) ?What is this medication? ?TADALAFIL (tah DA la fil) treats erectile dysfunction (ED). It works by increasing blood flow to the penis, which helps to maintain an erection. It may also be used to treat symptoms of an enlarged prostate (benign prostatic hyperplasia). ?This medicine may be used for other purposes; ask your health care provider or pharmacist if you have questions. ?COMMON BRAND NAME(S): Adcirca, ALYQ, Cialis ?What should I tell my care team before I take this medication? ?They need to know if you have any of these conditions: ?Anatomical deformation of the penis, Peyronie's disease, or history of priapism (painful and prolonged erection) ?Bleeding disorders ?Eye or vision problems, including a rare inherited eye disease called retinitis pigmentosa ?Heart disease, angina, a history of heart attack, irregular heart beats, or other heart problems ?High or low blood pressure ?History of blood diseases, like sickle cell anemia or leukemia ?History of stomach bleeding ?Kidney disease ?Liver disease ?Stroke ?An unusual or allergic reaction to tadalafil, other medications, foods, dyes, or preservatives ?Pregnant or trying to get pregnant ?Breast-feeding ?How should I use this medication? ?Take this medication by mouth with a glass of water. Follow the directions on the prescription label. You may take this medication with or without meals. When this medication is used for erection problems, your care team may prescribe it to be taken once daily or as needed. If you are taking the medication as needed, you may be able to have sexual activity 30 minutes after taking it and for up to 36 hours after taking it. Whether you are taking the medication as needed or once daily, you should not take more than one dose per day. If you are taking this medication for symptoms of benign prostatic hyperplasia (BPH) or to treat both BPH and an erection problem, take the dose once  daily at about the same time each day. Do not take your medication more often than directed. ?Talk to your care team about the use of this medication in children. Special care may be needed. ?Overdosage: If you think you have taken too much of this medicine contact a poison control center or emergency room at once. ?NOTE: This medicine is only for you. Do not share this medicine with others. ?What if I miss a dose? ?If you are taking this medication as needed for erection problems, this does not apply. If you miss a dose while taking this medication once daily for an erection problem, benign prostatic hyperplasia, or both, take it as soon as you remember, but do not take more than one dose per day. ?What may interact with this medication? ?Do not take this medication with any of the following: ?Nitrates like amyl nitrite, isosorbide dinitrate, isosorbide mononitrate, nitroglycerin ?Other medications for erectile dysfunction like avanafil, sildenafil, vardenafil ?Other tadalafil products (Adcirca) ?Riociguat ?This medication may also interact with the following: ?Certain medications for high blood pressure ?Certain medications for the treatment of HIV infection or AIDS ?Certain medications used for fungal or yeast infections, like fluconazole, itraconazole, ketoconazole, and voriconazole ?Certain medications used for seizures like carbamazepine, phenytoin, and phenobarbital ?Grapefruit juice ?Macrolide antibiotics like clarithromycin, erythromycin, troleandomycin ?Medications for prostate problems ?Rifabutin, rifampin or rifapentine ?This list may not describe all possible interactions. Give your health care provider a list of all the medicines, herbs, non-prescription drugs, or dietary supplements you use. Also tell them if you smoke, drink alcohol, or use illegal drugs. Some items may interact with your medicine. ?What should I watch for   while using this medication? ?If you notice any changes in your vision while  taking this medication, call your care team as soon as possible. Stop using this medication and call your care team right away if you have a loss of sight in one or both eyes. ?Contact your care team right away if the erection lasts longer than 4 hours or if it becomes painful. This may be a sign of serious problem and must be treated right away to prevent permanent damage. ?If you experience symptoms of nausea, dizziness, chest pain or arm pain upon initiation of sexual activity after taking this medication, you should refrain from further activity and call your care team as soon as possible. ?Do not drink alcohol to excess (examples, 5 glasses of wine or 5 shots of whiskey) when taking this medication. When taken in excess, alcohol can increase your chances of getting a headache or getting dizzy, increasing your heart rate or lowering your blood pressure. ?Using this medication does not protect you or your partner against HIV infection (the virus that causes AIDS) or other sexually transmitted diseases. ?What side effects may I notice from receiving this medication? ?Side effects that you should report to your care team as soon as possible: ?Allergic reactions--skin rash, itching, hives, swelling of the face, lips, tongue, or throat ?Hearing loss or ringing in ears ?Heart attack--pain or tightness in the chest, shoulders, arms, or jaw, nausea, shortness of breath, cold or clammy skin, feeling faint or lightheaded ?Low blood pressure--dizziness, feeling faint or lightheaded, blurry vision ?Prolonged or painful erection ?Redness, blistering, peeling, or loosening of the skin, including inside the mouth ?Stroke--sudden numbness or weakness of the face, arm, or leg, trouble speaking, confusion, trouble walking, loss of balance or coordination, dizziness, severe headache, change in vision ?Sudden vision loss in one or both eyes ?Side effects that usually do not require medical attention (report to your care team if  they continue or are bothersome): ?Back pain ?Facial flushing or redness ?Headache ?Muscle pain ?Runny or stuffy nose ?Upset stomach ?This list may not describe all possible side effects. Call your doctor for medical advice about side effects. You may report side effects to FDA at 1-800-FDA-1088. ?Where should I keep my medication? ?Keep out of the reach of children. ?Store at room temperature between 15 and 30 degrees C (59 and 86 degrees F). Throw away any unused medication after the expiration date. ?NOTE: This sheet is a summary. It may not cover all possible information. If you have questions about this medicine, talk to your doctor, pharmacist, or health care provider. ?? 2022 Elsevier/Gold Standard (2020-03-12 00:00:00) ? ?

## 2020-12-25 ENCOUNTER — Encounter: Payer: Self-pay | Admitting: Internal Medicine

## 2020-12-25 ENCOUNTER — Ambulatory Visit (INDEPENDENT_AMBULATORY_CARE_PROVIDER_SITE_OTHER): Payer: Medicaid Other | Admitting: Internal Medicine

## 2020-12-25 ENCOUNTER — Other Ambulatory Visit: Payer: Self-pay

## 2020-12-25 VITALS — BP 134/78 | HR 75 | Temp 98.1°F | Ht 70.08 in | Wt 284.0 lb

## 2020-12-25 DIAGNOSIS — I10 Essential (primary) hypertension: Secondary | ICD-10-CM

## 2020-12-25 DIAGNOSIS — E785 Hyperlipidemia, unspecified: Secondary | ICD-10-CM | POA: Diagnosis not present

## 2020-12-25 MED ORDER — LISINOPRIL-HYDROCHLOROTHIAZIDE 20-25 MG PO TABS: 1.0000 | ORAL_TABLET | Freq: Every day | ORAL | 3 refills | Status: DC

## 2020-12-25 NOTE — Patient Instructions (Signed)

## 2020-12-25 NOTE — Progress Notes (Signed)
BP 134/78    Pulse 75    Temp 98.1 F (36.7 C) (Oral)    Ht 5' 10.08" (1.78 m)    Wt 284 lb (128.8 kg)    SpO2 98%    BMI 40.66 kg/m    Subjective:    Patient ID: Paul [name], male    DOB: 17-Apr-1970, 50 y.o.   MRN: [id number]  Chief Complaint  Patient presents with   Hypertension    HPI: Paul [name] is a 50 y.o. male  Hypertension This is a chronic problem. The current episode started more than 1 year ago. The problem is controlled. Pertinent negatives include no anxiety, malaise/fatigue or peripheral edema.   Chief Complaint  Patient presents with   Hypertension    Relevant past medical, surgical, family and social history reviewed and updated as indicated. Interim medical history since our last visit reviewed. Allergies and medications reviewed and updated.  Review of Systems  Constitutional:  Negative for malaise/fatigue.   Per HPI unless specifically indicated above     Objective:    BP 134/78    Pulse 75    Temp 98.1 F (36.7 C) (Oral)    Ht 5' 10.08" (1.78 m)    Wt 284 lb (128.8 kg)    SpO2 98%    BMI 40.66 kg/m   Wt Readings from Last 3 Encounters:  12/25/20 284 lb (128.8 kg)  12/16/20 284 lb (128.8 kg)  12/11/20 284 lb (128.8 kg)    Physical Exam Vitals and nursing note reviewed.  Constitutional:      General: He is not in acute distress.    Appearance: Normal appearance. He is not ill-appearing or diaphoretic.  HENT:     Head: Normocephalic and atraumatic.     Right Ear: Tympanic membrane and external ear normal. There is no impacted cerumen.     Left Ear: External ear normal.     Nose: No congestion or rhinorrhea.     Mouth/Throat:     Pharynx: No oropharyngeal exudate or posterior oropharyngeal erythema.  Eyes:     Conjunctiva/sclera: Conjunctivae normal.     Pupils: Pupils are equal, round, and reactive to light.  Cardiovascular:     Rate and Rhythm: Normal rate and regular rhythm.     Heart sounds: No murmur heard.   No friction rub.  No gallop.  Pulmonary:     Effort: No respiratory distress.     Breath sounds: No stridor. No wheezing or rhonchi.  Chest:     Chest wall: No tenderness.  Abdominal:     General: Abdomen is flat. Bowel sounds are normal.     Palpations: Abdomen is soft. There is no mass.     Tenderness: There is no abdominal tenderness.  Musculoskeletal:        General: No swelling, tenderness or deformity.     Cervical back: Normal range of motion and neck supple. No rigidity or tenderness.     Left lower leg: No edema.  Skin:    General: Skin is warm and dry.  Neurological:     Mental Status: He is alert.  Psychiatric:        Mood and Affect: Mood normal.        Behavior: Behavior normal.        Thought Content: Thought content normal.    Results for orders placed or performed in visit on 12/11/20  Lipid panel  Result Value Ref Range   Cholesterol, Total 199 100 - 199  mg/dL   Triglycerides 381 (H) 0 - 149 mg/dL   HDL 47 >82 mg/dL   VLDL Cholesterol Cal 34 5 - 40 mg/dL   LDL Chol Calc (NIH) 993 (H) 0 - 99 mg/dL   Chol/HDL Ratio 4.2 0.0 - 5.0 ratio        Current Outpatient Medications:    Cholecalciferol 1.25 MG (50000 UT) TABS, Take 2 tablets by mouth daily., Disp: , Rfl:    lisinopril-hydrochlorothiazide (ZESTORETIC) 20-25 MG tablet, Take 1 tablet by mouth daily., Disp: 90 tablet, Rfl: 3   meloxicam (MOBIC) 15 MG tablet, Take 1 tablet (15 mg total) by mouth daily., Disp: 30 tablet, Rfl: 5   MULTIPLE VITAMIN PO, Take by mouth. Takes 2 gummy vitamins a day, Disp: , Rfl:    tadalafil (CIALIS) 5 MG tablet, Take 1 tablet (5 mg total) by mouth daily., Disp: 30 tablet, Rfl: 11   Testosterone 20 % CREA, Apply 100 mg topically 2 (two) times daily., Disp: 30 g, Rfl: 2    Assessment & Plan:  Htn : Is on 10 - 12.5 mg increase dose to 2 tabs daily, exercise 20 mins every other day cut back on salt and red meat.   Medication compliance emphasised. pt advised to keep Bp logs. Pt verbalised  understanding of the same. Pt to have a low salt diet . Exercise to reach a goal of at least 150 mins a week.  lifestyle modifications explained and pt understands importance of the above.   Obesity : BMI  Body mass index is 40.66 kg/m. Lifestyle modifications advised to pt.  Portion control and avoiding high carb low fat diet advised.  Diet plan given to pt   exercise plan given and encouraged.  To increase exercise to 150 mins a week ie 21/2 hours a week. Pt verbalises understanding of the above.   HLD TG better but stil high To work on diet and exercise. Problem List Items Addressed This Visit       Cardiovascular and Mediastinum   Essential hypertension, benign   Relevant Medications   lisinopril-hydrochlorothiazide (ZESTORETIC) 20-25 MG tablet   Other Relevant Orders   CBC with Differential/Platelet   Comprehensive metabolic panel   Lipid panel     Other   Hyperlipidemia - Primary   Relevant Medications   lisinopril-hydrochlorothiazide (ZESTORETIC) 20-25 MG tablet   Other Relevant Orders   CBC with Differential/Platelet   Comprehensive metabolic panel   Lipid panel     Orders Placed This Encounter  Procedures   CBC with Differential/Platelet   Comprehensive metabolic panel   Lipid panel     Meds ordered this encounter  Medications   lisinopril-hydrochlorothiazide (ZESTORETIC) 20-25 MG tablet    Sig: Take 1 tablet by mouth daily.    Dispense:  90 tablet    Refill:  3     Follow up plan: Return in about 3 months (around 03/25/2021).

## 2021-01-27 ENCOUNTER — Telehealth: Payer: Self-pay | Admitting: *Deleted

## 2021-01-27 NOTE — Telephone Encounter (Signed)
Mr Nahar called because we moved his appt to 03/04/21 and he will be out of his meloxicam. He is asking for a refill.

## 2021-01-28 ENCOUNTER — Ambulatory Visit: Payer: Medicaid Other | Admitting: Physical Medicine & Rehabilitation

## 2021-01-29 ENCOUNTER — Encounter: Payer: Self-pay | Admitting: *Deleted

## 2021-01-29 MED ORDER — MELOXICAM 15 MG PO TABS: 15.0000 mg | ORAL_TABLET | Freq: Every day | ORAL | 5 refills | Status: DC

## 2021-03-04 ENCOUNTER — Other Ambulatory Visit: Payer: Self-pay

## 2021-03-04 ENCOUNTER — Encounter: Payer: Medicaid Other | Attending: Physical Medicine & Rehabilitation | Admitting: Physical Medicine & Rehabilitation

## 2021-03-04 ENCOUNTER — Encounter: Payer: Self-pay | Admitting: Physical Medicine & Rehabilitation

## 2021-03-04 VITALS — BP 151/83 | HR 72 | Ht 70.0 in | Wt 281.0 lb

## 2021-03-04 DIAGNOSIS — M47816 Spondylosis without myelopathy or radiculopathy, lumbar region: Secondary | ICD-10-CM | POA: Diagnosis not present

## 2021-03-04 DIAGNOSIS — M7918 Myalgia, other site: Secondary | ICD-10-CM | POA: Diagnosis not present

## 2021-03-04 NOTE — Patient Instructions (Signed)
Please call for appt or if you want PT prior to next visit

## 2021-03-04 NOTE — Progress Notes (Signed)
Subjective:    Patient ID: Paul [name], male    DOB: Apr 05, 1970, 51 y.o.   MRN: [id number]  HPI  51 year old male with chronic lumbar pain he also complains of more diffuse joint pain such as bilateral knees.  He continues to work in [employer].  He has had no new injuries.  He was last seen approximately 7 months ago.  Lumbar MRI did not show any evidence of significant lumbar stenosis.  There was some mild degenerative changes mainly lower lumbar area. The patient has been taking meloxicam 15 mg/day for the last 6 months or so she he has had good relief of not only back symptoms but also knee pain. He continues to have 1 area around the waistline on the right side that is bothering him but not on a consistent basis.  Pain Inventory Average Pain 3 Pain Right Now 3 My pain is intermittent, sharp, dull, and aching  In the last 24 hours, has pain interfered with the following? General activity 7 Relation with others 0 Enjoyment of life 7 What TIME of day is your pain at its worst? evening Sleep (in general) Fair  Pain is worse with: bending and some activites Pain improves with: rest and heat/ice Relief from Meds:  na  Family History  Problem Relation Age of Onset   Healthy Mother    Cancer Father    Lung cancer Father    Social History   Socioeconomic History   Marital status: Married    Spouse name: Not on file   Number of children: Not on file   Years of education: Not on file   Highest education level: Not on file  Occupational History   Occupation: unemployed  Tobacco Use   Smoking status: Never   Smokeless tobacco: Never  Vaping Use   Vaping Use: Never used  Substance and Sexual Activity   Alcohol use: Yes    Alcohol/week: 18.0 standard drinks    Types: 18 Cans of beer per week   Drug use: Never   Sexual activity: Not Currently  Other Topics Concern   Not on file  Social History Narrative   Married for 8 months,second.Lives with wife and  daughter.Moved from [place] in 2018.   Social Determinants of Health   Financial Resource Strain: Not on file  Food Insecurity: Not on file  Transportation Needs: Not on file  Physical Activity: Not on file  Stress: Not on file  Social Connections: Not on file   Past Surgical History:  Procedure Laterality Date   APPENDECTOMY     HERNIA REPAIR     Past Surgical History:  Procedure Laterality Date   APPENDECTOMY     HERNIA REPAIR     Past Medical History:  Diagnosis Date   Hypertension    Vitamin D deficiency    BP (!) 151/83    Pulse 72    Ht $R'5\' 10"'og$  (1.778 m)    Wt 281 lb (127.5 kg)    SpO2 94%    BMI 40.32 kg/m   Opioid Risk Score:   Fall Risk Score:  `1  Depression screen PHQ 2/9  Depression screen [facility] 2/9 03/04/2021 12/11/2020 11/26/2020 07/28/2020 06/12/2020 05/14/2020 02/19/2020  Decreased Interest 0 0 0 0 0 1 0  Down, Depressed, Hopeless 0 0 1 0 0 1 0  PHQ - 2 Score 0 0 1 0 0 2 0  Altered sleeping - 1 0 - - 3 -  Tired, decreased energy - 0 0 - -  0 -  Change in appetite - 0 0 - - 2 -  Feeling bad or failure about yourself  - 0 1 - - 0 -  Trouble concentrating - 0 0 - - 0 -  Moving slowly or fidgety/restless - 0 0 - - 0 -  Suicidal thoughts - 0 0 - - 0 -  PHQ-9 Score - 1 2 - - 7 -  Difficult doing work/chores - Not difficult at all - - - Somewhat difficult -     Review of Systems  Musculoskeletal:  Positive for back pain.  All other systems reviewed and are negative.     Objective:   Physical Exam Vitals and nursing note reviewed.  Constitutional:      Appearance: He is obese.  Eyes:     Extraocular Movements: Extraocular movements intact.     Conjunctiva/sclera: Conjunctivae normal.     Pupils: Pupils are equal, round, and reactive to light.  Musculoskeletal:     Right lower leg: No edema.     Left lower leg: No edema.  Skin:    General: Skin is warm and dry.  Neurological:     Mental Status: He is alert and oriented to person, place, and time.   Psychiatric:        Mood and Affect: Mood normal.        Behavior: Behavior normal.    Tenderness palpation right lumbar paraspinals around 15 cm lateral to the spinous process.  There is also some tightness in that area to deep palpation. The patient has normal lumbar flexion and lateral bending extension is limited however not causing any pain. Negative straight leg raising bilaterally There is no tenderness of the greater trochanter of the hip there is no pain with hip range of motion. Ambulates without assistive device no evidence of toe drag or knee instability      Assessment & Plan:  #1.  Chronic lumbar pain appears to be multifactorial mild degenerative changes also with right lumbar paraspinal tenderness appears to be mainly muscular.  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  Would benefit from physical therapy but he would like to hold off on this at the current time.  He would likely only have limited visits given his insurance. We will continue meloxicam 15 mg/day return in 1 year We discussed potential side effects of meloxicam which she has not experienced.  Reviewed his last be met which is normal

## 2021-03-26 ENCOUNTER — Ambulatory Visit: Payer: Medicaid Other | Admitting: Internal Medicine

## 2021-04-01 ENCOUNTER — Encounter: Payer: Self-pay | Admitting: Internal Medicine

## 2021-04-01 ENCOUNTER — Ambulatory Visit (INDEPENDENT_AMBULATORY_CARE_PROVIDER_SITE_OTHER): Payer: Medicaid Other | Admitting: Internal Medicine

## 2021-04-01 ENCOUNTER — Other Ambulatory Visit: Payer: Self-pay

## 2021-04-01 VITALS — BP 133/79 | HR 77 | Temp 97.9°F | Ht 70.08 in | Wt 285.2 lb

## 2021-04-01 DIAGNOSIS — E785 Hyperlipidemia, unspecified: Secondary | ICD-10-CM

## 2021-04-01 DIAGNOSIS — R200 Anesthesia of skin: Secondary | ICD-10-CM

## 2021-04-01 DIAGNOSIS — E669 Obesity, unspecified: Secondary | ICD-10-CM | POA: Diagnosis not present

## 2021-04-01 LAB — BAYER DCA HB A1C WAIVED: HB A1C (BAYER DCA - WAIVED): 4.5 % — ABNORMAL LOW (ref 4.8–5.6)

## 2021-04-01 NOTE — Patient Instructions (Signed)
Obesity, Adult °Obesity is having too much body fat. Being obese means that your weight is more than what is healthy for you.  °BMI (body mass index) is a number that explains how much body fat you have. If you have a BMI of 30 or more, you are obese. °Obesity can cause serious health problems, such as: °Stroke. °Coronary artery disease (CAD). °Type 2 diabetes. °Some types of cancer. °High blood pressure (hypertension). °High cholesterol. °Gallbladder stones. °Obesity can also contribute to: °Osteoarthritis. °Sleep apnea. °Infertility problems. °What are the causes? °Eating meals each day that are high in calories, sugar, and fat. °Drinking a lot of drinks that have sugar in them. °Being born with genes that may make you more likely to become obese. °Having a medical condition that causes obesity. °Taking certain medicines. °Sitting a lot (having a sedentary lifestyle). °Not getting enough sleep. °What increases the risk? °Having a family history of obesity. °Living in an area with limited access to: °Parks, recreation centers, or sidewalks. °Healthy food choices, such as grocery stores and farmers' markets. °What are the signs or symptoms? °The main sign is having too much body fat. °How is this treated? °Treatment for this condition often includes changing your lifestyle. Treatment may include: °Changing your diet. This may include making a healthy meal plan. °Exercise. This may include activity that causes your heart to beat faster (aerobic exercise) and strength training. Work with your doctor to design a program that works for you. °Medicine to help you lose weight. This may be used if you are not able to lose one pound a week after 6 weeks of healthy eating and more exercise. °Treating conditions that cause the obesity. °Surgery. Options may include gastric banding and gastric bypass. This may be done if: °Other treatments have not helped to improve your condition. °You have a BMI of 40 or higher. °You have  life-threatening health problems related to obesity. °Follow these instructions at home: °Eating and drinking ° °Follow advice from your doctor about what to eat and drink. Your doctor may tell you to: °Limit fast food, sweets, and processed snack foods. °Choose low-fat options. For example, choose low-fat milk instead of whole milk. °Eat five or more servings of fruits or vegetables each day. °Eat at home more often. This gives you more control over what you eat. °Choose healthy foods when you eat out. °Learn to read food labels. This will help you learn how much food is in one serving. °Keep low-fat snacks available. °Avoid drinks that have a lot of sugar in them. These include soda, fruit juice, iced tea with sugar, and flavored milk. °Drink enough water to keep your pee (urine) pale yellow. °Do not go on fad diets. °Physical activity °Exercise often, as told by your doctor. Most adults should get up to 150 minutes of moderate-intensity exercise every week.Ask your doctor: °What types of exercise are safe for you. °How often you should exercise. °Warm up and stretch before being active. °Do slow stretching after being active (cool down). °Rest between times of being active. °Lifestyle °Work with your doctor and a food expert (dietitian) to set a weight-loss goal that is best for you. °Limit your screen time. °Find ways to reward yourself that do not involve food. °Do not drink alcohol if: °Your doctor tells you not to drink. °You are pregnant, may be pregnant, or are planning to become pregnant. °If you drink alcohol: °Limit how much you have to: °0-1 drink a day for women. °0-2 drinks   a day for men. °Know how much alcohol is in your drink. In the [place] one drink equals one 12 oz bottle of beer (355 mL), one 5 oz glass of wine (148 mL), or one 1½ oz glass of hard liquor (44 mL). °General instructions °Keep a weight-loss journal. This can help you keep track of: °The food that you eat. °How much exercise you  get. °Take over-the-counter and prescription medicines only as told by your doctor. °Take vitamins and supplements only as told by your doctor. °Think about joining a support group. °Pay attention to your mental health as obesity can lead to depression or self esteem issues. °Keep all follow-up visits. °Contact a doctor if: °You cannot meet your weight-loss goal after you have changed your diet and lifestyle for 6 weeks. °You are having trouble breathing. °Summary °Obesity is having too much body fat. °Being obese means that your weight is more than what is healthy for you. °Work with your doctor to set a weight-loss goal. °Get regular exercise as told by your doctor. °This information is not intended to replace advice given to you by your health care provider. Make sure you discuss any questions you have with your health care provider. °Document Revised: 08/04/2020 Document Reviewed: 08/04/2020 °Elsevier Patient Education © 2022 [employer] ° °

## 2021-04-01 NOTE — Progress Notes (Signed)
? ?BP 133/79   Pulse 77   Temp 97.9 ?F (36.6 ?C) (Oral)   Ht 5' 10.08" (1.78 m)   Wt 285 lb 3.2 oz (129.4 kg)   SpO2 97%   BMI 40.83 kg/m?   ? ?Subjective:  ? ? Patient ID: Paul [name], male    DOB: [birth year], 51 y.o.   MRN: [id number] ? ?Chief Complaint  ?Patient presents with  ? Hypertension  ? Hyperlipidemia  ? ? ?HPI: ?Paul [name] is a 51 y.o. male ? ?Hypertension ?This is a chronic problem. The current episode started more than 1 year ago. The problem is controlled. Pertinent negatives include no anxiety, blurred vision, chest pain, headaches, malaise/fatigue, neck pain, orthopnea, palpitations, peripheral edema or sweats. There is no history of chronic renal disease.  ?Hyperlipidemia ?This is a chronic problem. The problem is controlled. He has no history of chronic renal disease, diabetes, hypothyroidism, liver disease, obesity or nephrotic syndrome. Pertinent negatives include no chest pain.  ? ?Chief Complaint  ?Patient presents with  ? Hypertension  ? Hyperlipidemia  ? ? ?Relevant past medical, surgical, family and social history reviewed and updated as indicated. Interim medical history since our last visit reviewed. ?Allergies and medications reviewed and updated. ? ?Review of Systems  ?Constitutional:  Negative for malaise/fatigue.  ?Eyes:  Negative for blurred vision.  ?Cardiovascular:  Negative for chest pain, palpitations and orthopnea.  ?Musculoskeletal:  Negative for neck pain.  ?Neurological:  Negative for headaches.  ? ?Per HPI unless specifically indicated above ? ?   ?Objective:  ?  ?BP 133/79   Pulse 77   Temp 97.9 ?F (36.6 ?C) (Oral)   Ht 5' 10.08" (1.78 m)   Wt 285 lb 3.2 oz (129.4 kg)   SpO2 97%   BMI 40.83 kg/m?   ?Wt Readings from Last 3 Encounters:  ?04/01/21 285 lb 3.2 oz (129.4 kg)  ?03/04/21 281 lb (127.5 kg)  ?12/25/20 284 lb (128.8 kg)  ?  ?Physical Exam ?Vitals and nursing note reviewed.  ?Constitutional:   ?   General: He is not in acute distress. ?   Appearance:  Normal appearance. He is not ill-appearing or diaphoretic.  ?HENT:  ?   Head: Normocephalic and atraumatic.  ?   Right Ear: Tympanic membrane and external ear normal. There is no impacted cerumen.  ?   Left Ear: External ear normal.  ?   Nose: No congestion or rhinorrhea.  ?   Mouth/Throat:  ?   Pharynx: No oropharyngeal exudate or posterior oropharyngeal erythema.  ?Eyes:  ?   Conjunctiva/sclera: Conjunctivae normal.  ?   Pupils: Pupils are equal, round, and reactive to light.  ?Cardiovascular:  ?   Rate and Rhythm: Normal rate and regular rhythm.  ?   Heart sounds: No murmur heard. ?  No friction rub. No gallop.  ?Pulmonary:  ?   Effort: No respiratory distress.  ?   Breath sounds: No stridor. No wheezing or rhonchi.  ?Chest:  ?   Chest wall: No tenderness.  ?Abdominal:  ?   General: Abdomen is flat. Bowel sounds are normal.  ?   Palpations: Abdomen is soft. There is no mass.  ?   Tenderness: There is no abdominal tenderness.  ?Musculoskeletal:  ?   Cervical back: Normal range of motion and neck supple. No rigidity or tenderness.  ?   Left lower leg: No edema.  ?Skin: ?   General: Skin is warm and dry.  ?Neurological:  ?   Mental Status:  He is alert.  ? ? ?Results for orders placed or performed in visit on 04/01/21  ?Bayer DCA Hb A1c Waived (STAT)  ?Result Value Ref Range  ? HB A1C (BAYER DCA - WAIVED) 4.5 (L) 4.8 - 5.6 %  ?TSH  ?Result Value Ref Range  ? TSH 1.570 0.450 - 4.500 uIU/mL  ?B12  ?Result Value Ref Range  ? Vitamin B-12 341 232 - 1,245 pg/mL  ? ?   ? ? ?Current Outpatient Medications:  ?  Cholecalciferol 1.25 MG (50000 UT) TABS, Take 2 tablets by mouth daily., Disp: , Rfl:  ?  lisinopril-hydrochlorothiazide (ZESTORETIC) 20-25 MG tablet, Take 1 tablet by mouth daily., Disp: 90 tablet, Rfl: 3 ?  meloxicam (MOBIC) 15 MG tablet, Take 1 tablet (15 mg total) by mouth daily., Disp: 30 tablet, Rfl: 5 ?  MULTIPLE VITAMIN PO, Take by mouth. Takes 2 gummy vitamins a day, Disp: , Rfl:  ?  tadalafil (CIALIS) 5 MG  tablet, Take 1 tablet (5 mg total) by mouth daily., Disp: 30 tablet, Rfl: 11  ? ? ?Assessment & Plan:  ?Chronic lumbar pain seeing PT is on meloxicam for such ? ?HTN:  ?Is on liisnopril / hctz for scuh  ?Continue current meds.  Medication compliance emphasised. pt advised to keep Bp logs. Pt verbalised understanding of the same. Pt to have a low salt diet . Exercise to reach a goal of at least 150 mins a week.  lifestyle modifications explained and pt understands importance of the above. ?Under good control on current regimen. Continue current regimen. Continue to monitor. Call with any concerns. Refills given. Labs drawn today. ? ?BMI is at 40  ? ?Obesity :  ?Lifestyle modifications advised to pt. A1c at  ? Portion control and avoiding high carb low fat diet advised.  Diet plan given to pt   exercise plan given and encouraged.  To increase exercise to 150 mins a week ie 21/2 hours a week. Pt verbalises understanding of the above.  ? ? ?Problem List Items Addressed This Visit   ? ?  ? Other  ? Hyperlipidemia - Primary  ? Relevant Orders  ? CBC with Differential/Platelet  ? Comprehensive metabolic panel  ? Lipid panel  ? Urinalysis, Routine w reflex microscopic  ? TSH  ? ?Other Visit Diagnoses   ? ? Numbness in both hands      ? Relevant Orders  ? Bayer DCA Hb A1c Waived (STAT) (Completed)  ? TSH (Completed)  ? B12 (Completed)  ? ?  ?  ? ?Orders Placed This Encounter  ?Procedures  ? CBC with Differential/Platelet  ? Comprehensive metabolic panel  ? Lipid panel  ? Urinalysis, Routine w reflex microscopic  ? TSH  ? Bayer DCA Hb A1c Waived (STAT)  ? TSH  ? B12  ?  ? ?No orders of the defined types were placed in this encounter. ?  ? ?Follow up plan: ?Return in about 6 months (around 10/02/2021). ? ? ?

## 2021-04-02 LAB — TSH: TSH: 1.57 u[IU]/mL (ref 0.450–4.500)

## 2021-04-02 LAB — VITAMIN B12: Vitamin B-12: 341 pg/mL (ref 232–1245)

## 2021-08-11 ENCOUNTER — Other Ambulatory Visit: Payer: Self-pay | Admitting: Physical Medicine & Rehabilitation

## 2021-08-26 ENCOUNTER — Encounter: Payer: Self-pay | Admitting: Internal Medicine

## 2021-10-04 ENCOUNTER — Ambulatory Visit: Payer: Medicaid Other | Admitting: Internal Medicine

## 2021-10-04 ENCOUNTER — Encounter: Payer: Self-pay | Admitting: Urology

## 2021-10-05 ENCOUNTER — Encounter: Payer: Self-pay | Admitting: Physician Assistant

## 2021-10-05 ENCOUNTER — Other Ambulatory Visit: Payer: Medicaid Other

## 2021-10-05 ENCOUNTER — Telehealth (INDEPENDENT_AMBULATORY_CARE_PROVIDER_SITE_OTHER): Payer: Medicaid Other | Admitting: Physician Assistant

## 2021-10-05 DIAGNOSIS — E785 Hyperlipidemia, unspecified: Secondary | ICD-10-CM | POA: Diagnosis not present

## 2021-10-05 DIAGNOSIS — I10 Essential (primary) hypertension: Secondary | ICD-10-CM

## 2021-10-05 NOTE — Assessment & Plan Note (Signed)
Chronic, historic condition Patient has been trying to reduce saturated fats and fried foods in diet since last cholesterol results Will recheck today to assess efficacy of lifestyle modifications Results to dictate further management Follow up in 6 months for monitoring.

## 2021-10-05 NOTE — Assessment & Plan Note (Signed)
Chronic, historic condition Appears to be well controlled on lisinopril-HCTZ 20-25 mg PO QD and seems to be tolerating well Continue current medication regimen Encouraged him to continue with dietary changes and leading active lifestyle Follow up in 6 months or sooner if concerns arise.

## 2021-10-05 NOTE — Progress Notes (Signed)
Virtual Visit via Video Note  I connected with Paul [name] on 10/05/21 at  8:20 AM EDT by a video enabled telemedicine application and verified that I am speaking with the correct person using two identifiers.  Today's Provider: Talitha Givens, MHS, PA-C Introduced myself to the patient as a PA-C and provided education on APPs in clinical practice.   Location: Patient: at clinic office, [name], [place]  Provider: at home, [place], [place]    I discussed the limitations of evaluation and management by telemedicine and the availability of in person appointments. The patient expressed understanding and agreed to proceed.  Chief Complaint  Patient presents with   Hypertension   Hyperlipidemia     History of Present Illness:   HYPERTENSION / HYPERLIPIDEMIA Satisfied with current treatment? yes Duration of hypertension: chronic BP monitoring frequency: a few times a month BP range: unsure  BP medication side effects: no Past BP meds: lisinopril-HCTZ Duration of hyperlipidemia: chronic Cholesterol medication side effects:  not on medication for HLD Cholesterol supplements: none Past cholesterol medications: none Medication compliance:  not taking  Aspirin: no Recent stressors: no Recurrent headaches: no Visual changes: no Palpitations: no Dyspnea: no Chest pain: no Lower extremity edema: no Dizzy/lightheaded: no Diet: has avoided deep fried foods since last apt in 12/23 Exercise: Not engaged in regular exercise, active lifestyle instead     Review of Systems  HENT:  Negative for hearing loss and tinnitus.   Eyes:  Negative for blurred vision and double vision.  Respiratory:  Negative for shortness of breath and wheezing.   Cardiovascular:  Negative for chest pain, palpitations and leg swelling.  Neurological:  Negative for dizziness and headaches.    Outpatient Encounter Medications as of 10/05/2021  Medication Sig   Cholecalciferol 1.25 MG (50000 UT) TABS Take 2  tablets by mouth daily.   lisinopril-hydrochlorothiazide (ZESTORETIC) 20-25 MG tablet Take 1 tablet by mouth daily.   meloxicam (MOBIC) 15 MG tablet TAKE 1 TABLET(15 MG) BY MOUTH DAILY   MULTIPLE VITAMIN PO Take by mouth. Takes 2 gummy vitamins a day   tadalafil (CIALIS) 5 MG tablet Take 1 tablet (5 mg total) by mouth daily.   No facility-administered encounter medications on file as of 10/05/2021.     Observations/Objective:   Due to the nature of the virtual visit, physical exam and observations are limited. Able to obtain the following observations:   Alert, oriented, Appears comfortable, in no acute distress.  No scleral injection, no appreciated hoarseness, tachypnea, wheeze or strider. Able to maintain conversation without visible strain.  No cough appreciated during visit.    Assessment and Plan:  Problem List Items Addressed This Visit       Cardiovascular and Mediastinum   Essential hypertension, benign    Chronic, historic condition Appears to be well controlled on lisinopril-HCTZ 20-25 mg PO QD and seems to be tolerating well Continue current medication regimen Encouraged him to continue with dietary changes and leading active lifestyle Follow up in 6 months or sooner if concerns arise.        Relevant Orders   Comp Met (CMET)   CBC w/Diff     Other   Hyperlipidemia - Primary    Chronic, historic condition Patient has been trying to reduce saturated fats and fried foods in diet since last cholesterol results Will recheck today to assess efficacy of lifestyle modifications Results to dictate further management Follow up in 6 months for monitoring.        Relevant  Orders   Lipid Profile   Follow Up Instructions:    I discussed the assessment and treatment plan with the patient. The patient was provided an opportunity to ask questions and all were answered. The patient agreed with the plan and demonstrated an understanding of the instructions.   The  patient was advised to call back or seek an in-person evaluation if the symptoms worsen or if the condition fails to improve as anticipated.  I provided  7 minutes of non-face-to-face time during this encounter.  Return in about 6 months (around 04/05/2022) for HTN,HLD.   I, Donell Sliwinski E Yasenia Reedy, PA-C, have reviewed all documentation for this visit. The documentation on 10/05/21 for the exam, diagnosis, procedures, and orders are all accurate and complete.   Talitha Givens, MHS, PA-C [facility]

## 2021-10-06 LAB — CBC WITH DIFFERENTIAL/PLATELET
Basophils Absolute: 0 10*3/uL (ref 0.0–0.2)
Basos: 1 %
EOS (ABSOLUTE): 0.1 10*3/uL (ref 0.0–0.4)
Eos: 3 %
Hematocrit: 44.4 % (ref 37.5–51.0)
Hemoglobin: 15.2 g/dL (ref 13.0–17.7)
Immature Grans (Abs): 0 10*3/uL (ref 0.0–0.1)
Immature Granulocytes: 0 %
Lymphocytes Absolute: 1.1 10*3/uL (ref 0.7–3.1)
Lymphs: 30 %
MCH: 30.6 pg (ref 26.6–33.0)
MCHC: 34.2 g/dL (ref 31.5–35.7)
MCV: 89 fL (ref 79–97)
Monocytes Absolute: 0.3 10*3/uL (ref 0.1–0.9)
Monocytes: 8 %
Neutrophils Absolute: 2.2 10*3/uL (ref 1.4–7.0)
Neutrophils: 58 %
Platelets: 152 10*3/uL (ref 150–450)
RBC: 4.97 x10E6/uL (ref 4.14–5.80)
RDW: 11.8 % (ref 11.6–15.4)
WBC: 3.7 10*3/uL (ref 3.4–10.8)

## 2021-10-06 LAB — LIPID PANEL
Chol/HDL Ratio: 4.1 ratio (ref 0.0–5.0)
Cholesterol, Total: 180 mg/dL (ref 100–199)
HDL: 44 mg/dL (ref >39)
LDL Chol Calc (NIH): 105 mg/dL — ABNORMAL HIGH (ref 0–99)
Triglycerides: 176 mg/dL — ABNORMAL HIGH (ref 0–149)
VLDL Cholesterol Cal: 31 mg/dL (ref 5–40)

## 2021-10-06 LAB — COMPREHENSIVE METABOLIC PANEL
ALT: 35 IU/L (ref 0–44)
AST: 23 IU/L (ref 0–40)
Albumin/Globulin Ratio: 1.7 (ref 1.2–2.2)
Albumin: 4.5 g/dL (ref 3.8–4.9)
Alkaline Phosphatase: 77 IU/L (ref 44–121)
BUN/Creatinine Ratio: 16 (ref 9–20)
BUN: 16 mg/dL (ref 6–24)
Bilirubin Total: 0.5 mg/dL (ref 0.0–1.2)
CO2: 21 mmol/L (ref 20–29)
Calcium: 9.5 mg/dL (ref 8.7–10.2)
Chloride: 98 mmol/L (ref 96–106)
Creatinine, Ser: 0.99 mg/dL (ref 0.76–1.27)
Globulin, Total: 2.6 g/dL (ref 1.5–4.5)
Glucose: 128 mg/dL — ABNORMAL HIGH (ref 70–99)
Potassium: 4.2 mmol/L (ref 3.5–5.2)
Sodium: 135 mmol/L (ref 134–144)
Total Protein: 7.1 g/dL (ref 6.0–8.5)
eGFR: 92 mL/min/{1.73_m2} (ref >59)

## 2021-10-06 LAB — TSH: TSH: 1.84 u[IU]/mL (ref 0.450–4.500)

## 2021-12-05 IMAGING — MG DIGITAL DIAGNOSTIC BILAT W/ TOMO W/ CAD
8 of 14 series · 9 of 40 positions shown · non-contrast
Comparison: None.

ACR Breast Density Category a: The breast tissue is almost entirely
fatty.

CLINICAL DATA: 49-year-old male with a palpable abnormality with
associated mild tenderness involving the central/retroareolar left
breast.

EXAM:
DIGITAL DIAGNOSTIC BILATERAL MAMMOGRAM WITH TOMO AND CAD

[L MLO synth-2D]
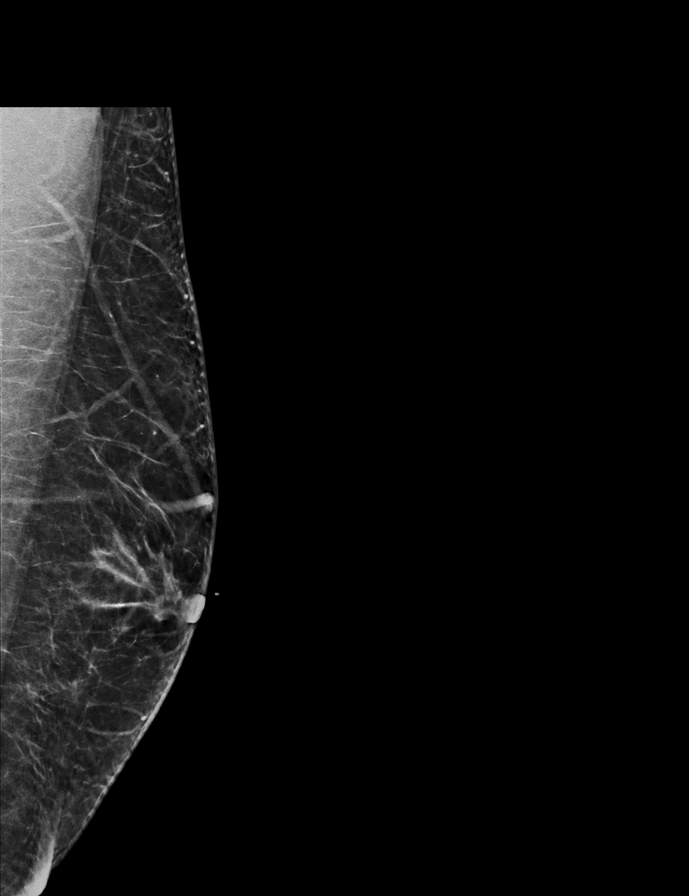

[R MLO synth-2D]
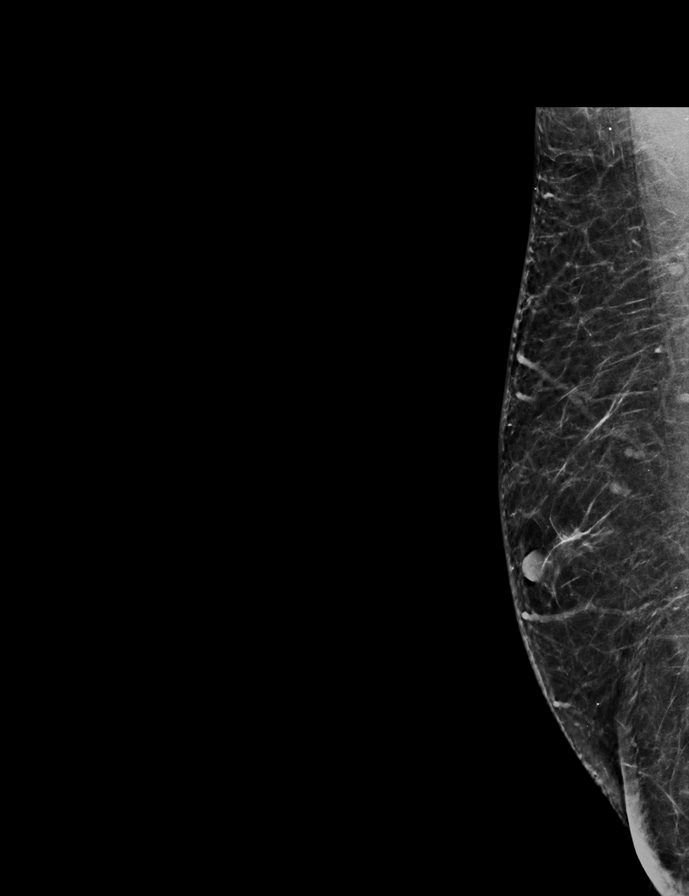

[R CC synth-2D (1 of 2)]
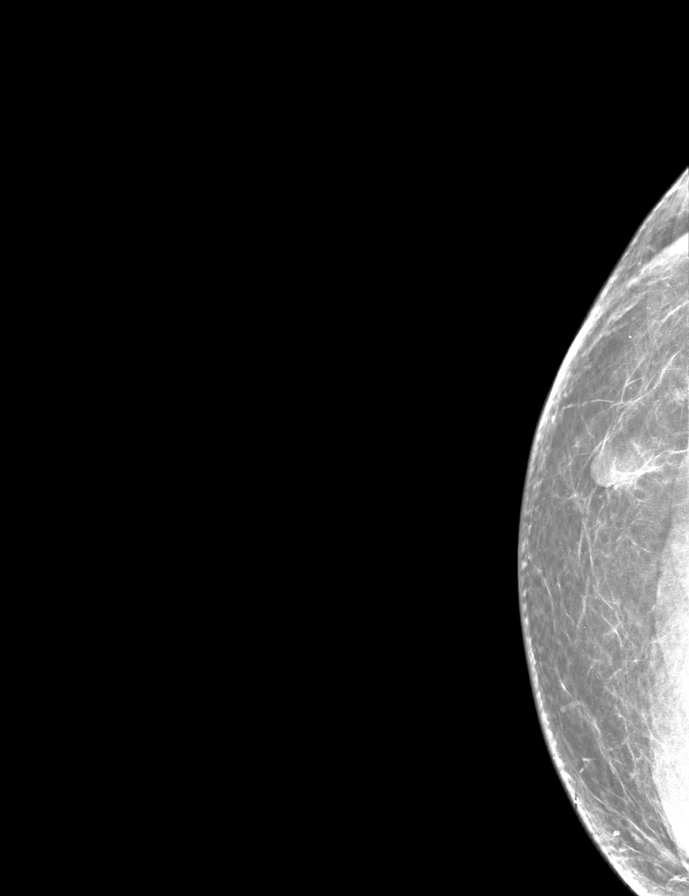

[L CC synth-2D (1 of 2)]
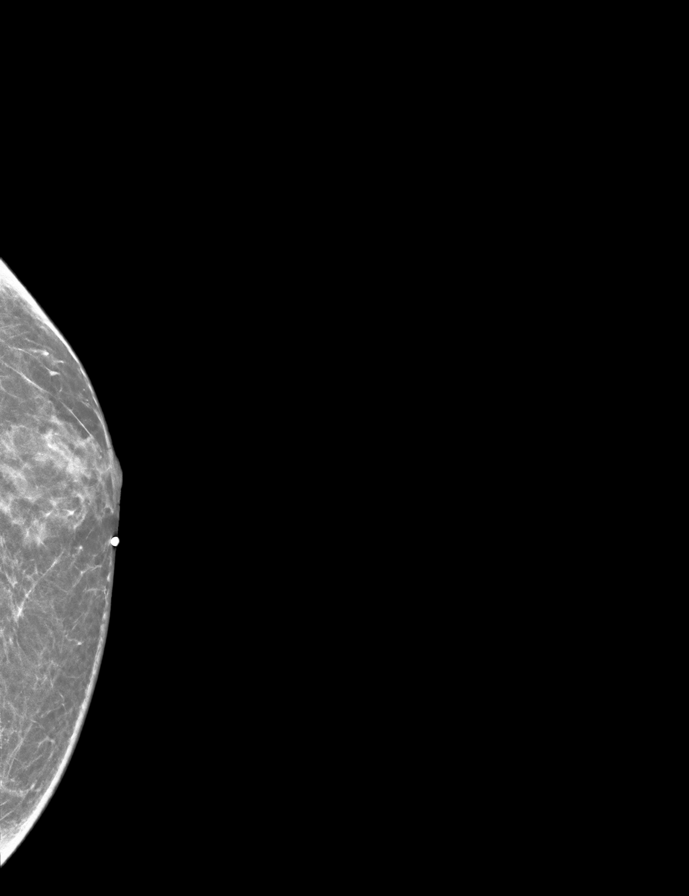

[L TAN synth-2D]
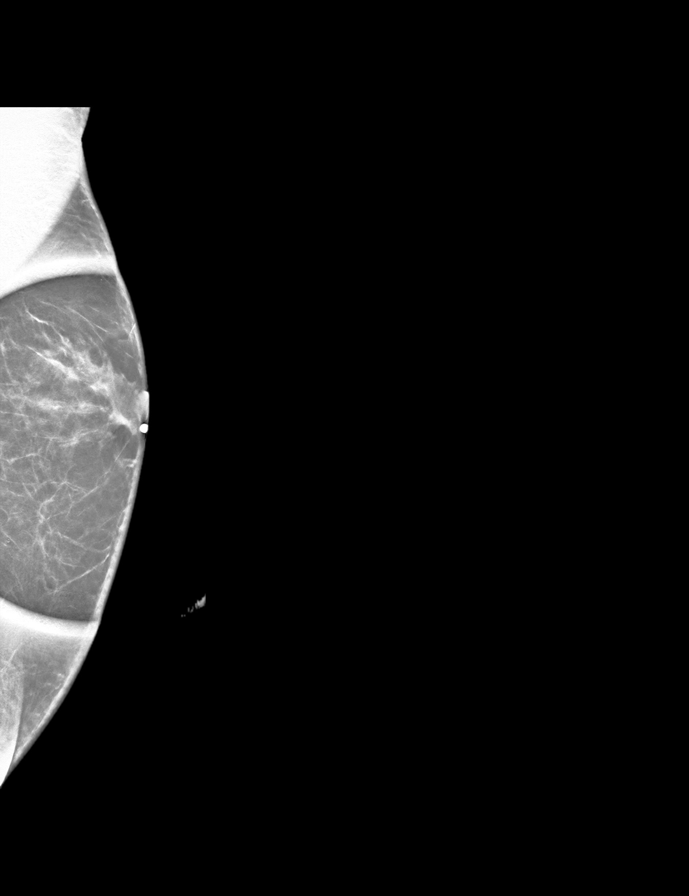

[L CC synth-2D (2 of 2)]
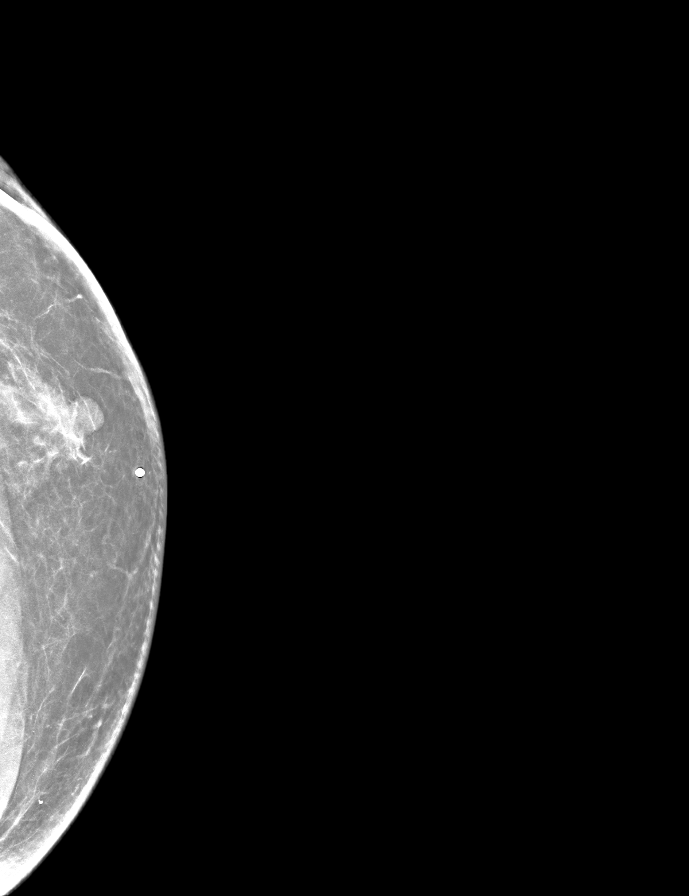

[R CC synth-2D (2 of 2)]
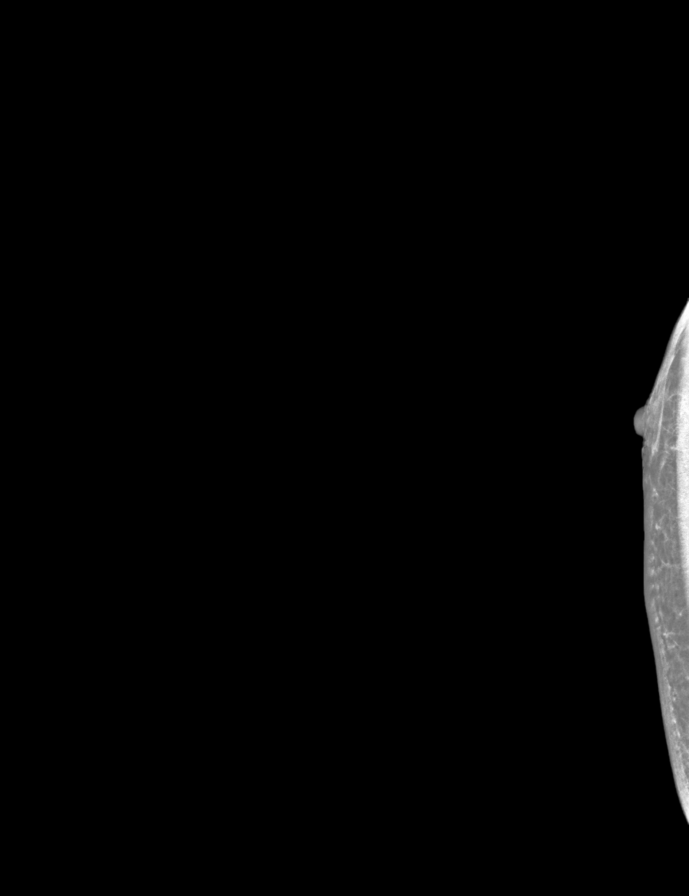

[R CC tomo · 2 of 32 frames shown]
[frame 11/32]
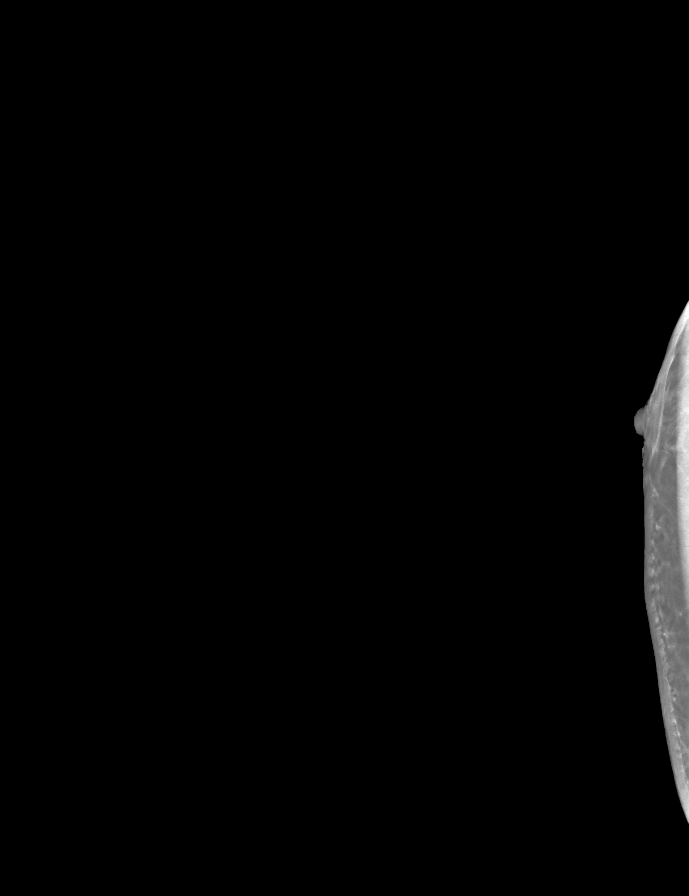
[frame 17/32]
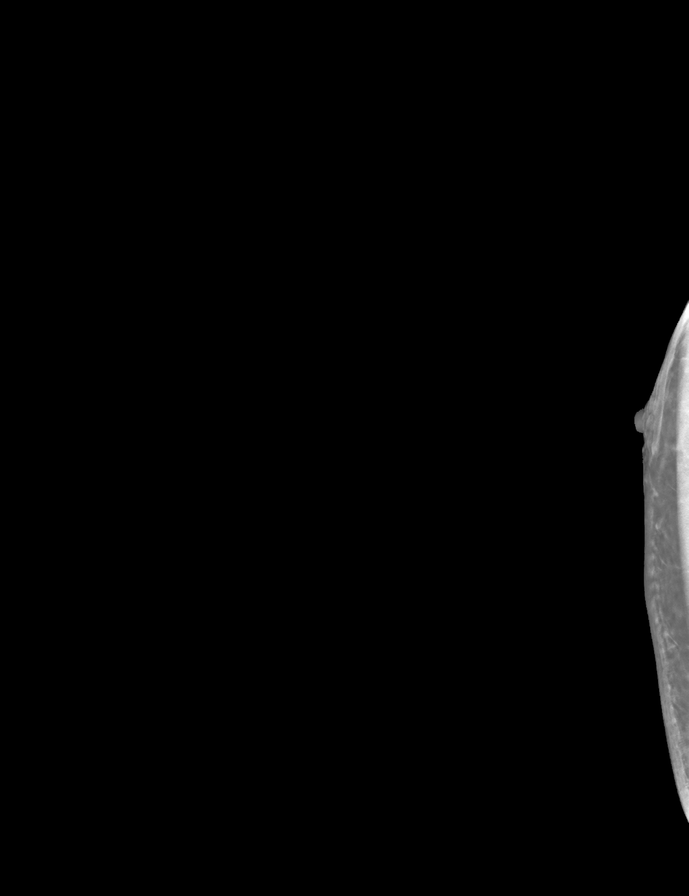

[9 of 40 positions shown; findings below may reference images not displayed]

FINDINGS: No suspicious masses or calcifications are seen in either breast.
There is a left retroareolar flame shaped density which corresponds
with the site of palpable concern, most compatible with
gynecomastia.

Mammographic images were processed with CAD.
IMPRESSION: Gynecomastia, accounting for the left breast palpable abnormality
with associated tenderness. No findings of malignancy.

RECOMMENDATION:
Clinical follow-up.

I have discussed the findings and recommendations with the patient.
If applicable, a reminder letter will be sent to the patient
regarding the next appointment.

BI-RADS CATEGORY  2: Benign.

## 2021-12-16 ENCOUNTER — Ambulatory Visit: Payer: Medicaid Other | Admitting: Urology

## 2021-12-23 ENCOUNTER — Encounter: Payer: Self-pay | Admitting: Urology

## 2021-12-23 ENCOUNTER — Ambulatory Visit (INDEPENDENT_AMBULATORY_CARE_PROVIDER_SITE_OTHER): Payer: Medicaid Other | Admitting: Urology

## 2021-12-23 VITALS — BP 156/78 | HR 74 | Ht 70.0 in | Wt 282.0 lb

## 2021-12-23 DIAGNOSIS — N529 Male erectile dysfunction, unspecified: Secondary | ICD-10-CM

## 2021-12-23 DIAGNOSIS — Z125 Encounter for screening for malignant neoplasm of prostate: Secondary | ICD-10-CM

## 2021-12-23 MED ORDER — TADALAFIL 10 MG PO TABS: 10.0000 mg | ORAL_TABLET | Freq: Every day | ORAL | 3 refills | Status: DC

## 2021-12-23 NOTE — Patient Instructions (Signed)
Prostate Cancer Screening  Prostate cancer screening is testing that is done to check for the presence of prostate cancer in men. The prostate gland is a walnut-sized gland that is located below the bladder and in front of the rectum in males. The function of the prostate is to add fluid to semen during ejaculation. Prostate cancer is one of the most common types of cancer in men. Who should have prostate cancer screening? Screening recommendations vary based on age and other risk factors, as well as between the professional organizations who make the recommendations. In general, screening is recommended if: You are age 50 to 70 and have an average risk for prostate cancer. You should talk with your health care provider about your need for screening and how often screening should be done. Because most prostate cancers are slow growing and will not cause death, screening in this age group is generally reserved for men who have a 10- to 15-year life expectancy. You are younger than age 50, and you have these risk factors: Having a father, brother, or uncle who has been diagnosed with prostate cancer. The risk is higher if your family member's cancer occurred at an early age or if you have multiple family members with prostate cancer at an early age. Being a male who is Black or is of Caribbean or sub-Saharan African descent. In general, screening is not recommended if: You are younger than age 40. You are between the ages of 40 and 49 and you have no risk factors. You are 70 years of age or older. At this age, the risks that screening can cause are greater than the benefits that it may provide. If you are at high risk for prostate cancer, your health care provider may recommend that you have screenings more often or that you start screening at a younger age. How is screening for prostate cancer done? The recommended prostate cancer screening test is a blood test called the prostate-specific antigen  (PSA) test. PSA is a protein that is made in the prostate. As you age, your prostate naturally produces more PSA. Abnormally high PSA levels may be caused by: Prostate cancer. An enlarged prostate that is not caused by cancer (benign prostatic hyperplasia, or BPH). This condition is very common in older men. A prostate gland infection (prostatitis) or urinary tract infection. Certain medicines such as male hormones (like testosterone) or other medicines that raise testosterone levels. A rectal exam may be done as part of prostate cancer screening to help provide information about the size of your prostate gland. When a rectal exam is performed, it should be done after the PSA level is drawn to avoid any effect on the results. Depending on the PSA results, you may need more tests, such as: A physical exam to check the size of your prostate gland, if not done as part of screening. Blood and imaging tests. A procedure to remove tissue samples from your prostate gland for testing (biopsy). This is the only way to know for certain if you have prostate cancer. What are the benefits of prostate cancer screening? Screening can help to identify cancer at an early stage, before symptoms start and when the cancer can be treated more easily. There is a small chance that screening may lower your risk of dying from prostate cancer. The chance is small because prostate cancer is a slow-growing cancer, and most men with prostate cancer die from a different cause. What are the risks of prostate cancer screening? The   main risk of prostate cancer screening is diagnosing and treating prostate cancer that would never have caused any symptoms or problems. This is called overdiagnosisand overtreatment. PSA screening cannot tell you if your PSA is high due to cancer or a different cause. A prostate biopsy is the only procedure to diagnose prostate cancer. Even the results of a biopsy may not tell you if your cancer needs to  be treated. Slow-growing prostate cancer may not need any treatment other than monitoring, so diagnosing and treating it may cause unnecessary stress or other side effects. Questions to ask your health care provider When should I start prostate cancer screening? What is my risk for prostate cancer? How often do I need screening? What type of screening tests do I need? How do I get my test results? What do my results mean? Do I need treatment? Where to find more information The American Cancer Society: www.cancer.org American Urological Association: www.auanet.org Contact a health care provider if: You have difficulty urinating. You have pain when you urinate or ejaculate. You have blood in your urine or semen. You have pain in your back or in the area of your prostate. Summary Prostate cancer is a common type of cancer in men. The prostate gland is located below the bladder and in front of the rectum. This gland adds fluid to semen during ejaculation. Prostate cancer screening may identify cancer at an early stage, when the cancer can be treated more easily and is less likely to have spread to other areas of the body. The prostate-specific antigen (PSA) test is the recommended screening test for prostate cancer, but it has associated risks. Discuss the risks and benefits of prostate cancer screening with your health care provider. If you are age 70 or older, the risks that screening can cause are greater than the benefits that it may provide. This information is not intended to replace advice given to you by your health care provider. Make sure you discuss any questions you have with your health care provider. Document Revised: 06/22/2020 Document Reviewed: 06/22/2020 Elsevier Patient Education  2023 Elsevier Inc.  Erectile Dysfunction Erectile dysfunction (ED) is the inability to get or keep an erection in order to have sexual intercourse. ED is considered a symptom of an underlying  disorder and is not considered a disease. ED may include: Inability to get an erection. Lack of enough hardness of the erection to allow penetration. Loss of erection before sex is finished. What are the causes? This condition may be caused by: Physical causes, such as: Artery problems. This may include heart disease, high blood pressure, atherosclerosis, and diabetes. Hormonal problems, such as low testosterone. Obesity. Nerve problems. This may include back or pelvic injuries, multiple sclerosis, Parkinson's disease, spinal cord injury, and stroke. Certain medicines, such as: Pain relievers. Antidepressants. Blood pressure medicines and water pills (diuretics). Cancer medicines. Antihistamines. Muscle relaxants. Lifestyle factors, such as: Use of drugs such as marijuana, cocaine, or opioids. Excessive use of alcohol. Smoking. Lack of physical activity or exercise. Psychological causes, such as: Anxiety or stress. Sadness or depression. Exhaustion. Fear about sexual performance. Guilt. What are the signs or symptoms? Symptoms of this condition include: Inability to get an erection. Lack of enough hardness of the erection to allow penetration. Loss of the erection before sex is finished. Sometimes having normal erections, but with frequent unsatisfactory episodes. Low sexual satisfaction in either partner due to erection problems. A curved penis occurring with erection. The curve may cause pain, or the penis  may be too curved to allow for intercourse. Never having nighttime or morning erections. How is this diagnosed? This condition is often diagnosed by: Performing a physical exam to find other diseases or specific problems with the penis. Asking you detailed questions about the problem. Doing tests, such as: Blood tests to check for diabetes mellitus or high cholesterol, or to measure hormone levels. Other tests to check for underlying health conditions. An ultrasound  exam to check for scarring. A test to check blood flow to the penis. Doing a sleep study at home to measure nighttime erections. How is this treated? This condition may be treated by: Medicines, such as: Medicine taken by mouth to help you achieve an erection (oral medicine). Hormone replacement therapy to replace low testosterone levels. Medicine that is injected into the penis. Your health care provider may instruct you how to give yourself these injections at home. Medicine that is delivered with a short applicator tube. The tube is inserted into the opening at the tip of the penis, which is the opening of the urethra. A tiny pellet of medicine is put in the urethra. The pellet dissolves and enhances erectile function. This is also called MUSE (medicated urethral system for erections) therapy. Vacuum pump. This is a pump with a ring on it. The pump and ring are placed on the penis and used to create pressure that helps the penis become erect. Penile implant surgery. In this procedure, you may receive: An inflatable implant. This consists of cylinders, a pump, and a reservoir. The cylinders can be inflated with a fluid that helps to create an erection, and they can be deflated after intercourse. A semi-rigid implant. This consists of two silicone rubber rods. The rods provide some rigidity. They are also flexible, so the penis can both curve downward in its normal position and become straight for sexual intercourse. Blood vessel surgery to improve blood flow to the penis. During this procedure, a blood vessel from a different part of the body is placed into the penis to allow blood to flow around (bypass) damaged or blocked blood vessels. Lifestyle changes, such as exercising more, losing weight, and quitting smoking. Follow these instructions at home: Medicines  Take over-the-counter and prescription medicines only as told by your health care provider. Do not increase the dosage without first  discussing it with your health care provider. If you are using self-injections, do injections as directed by your health care provider. Make sure you avoid any veins that are on the surface of the penis. After giving an injection, apply pressure to the injection site for 5 minutes. Talk to your health care provider about how to prevent headaches while taking ED medicines. These medicines may cause a sudden headache due to the increase in blood flow in your body. General instructions Exercise regularly, as directed by your health care provider. Work with your health care provider to lose weight, if needed. Do not use any products that contain nicotine or tobacco. These products include cigarettes, chewing tobacco, and vaping devices, such as e-cigarettes. If you need help quitting, ask your health care provider. Before using a vacuum pump, read the instructions that come with the pump and discuss any questions with your health care provider. Keep all follow-up visits. This is important. Contact a health care provider if: You feel nauseous. You are vomiting. You get sudden headaches while taking ED medicines. You have any concerns about your sexual health. Get help right away if: You are taking oral or  injectable medicines and you have an erection that lasts longer than 4 hours. If your health care provider is unavailable, go to the nearest emergency room for evaluation. An erection that lasts much longer than 4 hours can result in permanent damage to your penis. You have severe pain in your groin or abdomen. You develop redness or severe swelling of your penis. You have redness spreading at your groin or lower abdomen. You are unable to urinate. You experience chest pain or a rapid heartbeat (palpitations) after taking oral medicines. These symptoms may represent a serious problem that is an emergency. Do not wait to see if the symptoms will go away. Get medical help right away. Call your local  emergency services (911 in the [place] Do not drive yourself to the hospital. Summary Erectile dysfunction (ED) is the inability to get or keep an erection during sexual intercourse. This condition is diagnosed based on a physical exam, your symptoms, and tests to determine the cause. Treatment varies depending on the cause and may include medicines, hormone therapy, surgery, or a vacuum pump. You may need follow-up visits to make sure that you are using your medicines or devices correctly. Get help right away if you are taking or injecting medicines and you have an erection that lasts longer than 4 hours. This information is not intended to replace advice given to you by your health care provider. Make sure you discuss any questions you have with your health care provider. Document Revised: 03/25/2020 Document Reviewed: 03/25/2020 Elsevier Patient Education  2023 [employer].

## 2021-12-23 NOTE — Progress Notes (Signed)
   12/23/2021 8:18 AM   Paul [name] 1970/06/12 [id number]  Reason for visit: Follow up ED, low testosterone, PSA screening  HPI: 51 year old male who I saw last in December 2022 for problems with erections.  He was on testosterone replacement through his PCP at that point which was prescribed for low libido.  He actually stopped that about a year ago and has not noticed any significant changes, no recent testosterone values to review.  At our last visit we started Cialis 5 mg daily for ED and he has had good results on this medication.  Occasionally he still has some problems with erections but overall going well.  We discussed the Cialis could be increased to 10 mg daily, and can be titrated to find the correct dosing.  We reviewed that this could be taken daily, every other day, or as needed.   In terms of PSA screening, we reviewed the AUA guidelines that now recommend screening every 2 to 4 years, and I recommended he get a PSA done with his routine lab work with PCP next month.  Risk and benefits of screening were reviewed.  Last value was 1.2 in April 2021.  Cialis dose increased to 10 mg daily, can be refilled by PCP moving forward Recommended PSA screening every 2 to 4 years through PCP Can follow-up with urology as needed  Sondra Come, MD  [facility] [address] [place], [place] [address] [phone]

## 2022-01-04 ENCOUNTER — Other Ambulatory Visit: Payer: Self-pay

## 2022-01-04 MED ORDER — LISINOPRIL-HYDROCHLOROTHIAZIDE 20-25 MG PO TABS: 1.0000 | ORAL_TABLET | Freq: Every day | ORAL | 3 refills | Status: DC

## 2022-01-04 NOTE — Telephone Encounter (Signed)
Medication refill for lisinopril/HCTZ 10/12.5 mg last ov 10/05/21, upcoming ov 04/05/22 . Please advise

## 2022-01-25 ENCOUNTER — Other Ambulatory Visit: Payer: Self-pay | Admitting: Physical Medicine & Rehabilitation

## 2022-02-22 ENCOUNTER — Ambulatory Visit: Payer: Self-pay | Admitting: *Deleted

## 2022-02-22 NOTE — Telephone Encounter (Signed)
Message from Erick Blinks sent at 02/22/2022 10:20 AM EST  Summary: Possible BP issue, dizziness and vertigo   Pt is experiencing dizziness/vertigo, could be blood pressure related. His wife called and says you can contact her for scheduling. Best contact: [phone]          Call History   Type Contact Phone/Fax User  02/22/2022 10:19 AM EST Phone (Incoming) Brunelli,hillary (Emergency Contact) [phone] Brynda Greathouse, [place]   Reason for Disposition  [1] MODERATE dizziness (e.g., interferes with normal activities) AND [2] has NOT been evaluated by doctor (or NP/PA) for this  (Exception: Dizziness caused by heat exposure, sudden standing, or poor fluid intake.)  Answer Assessment - Initial Assessment Questions 1. DESCRIPTION: "Describe your dizziness."     I called his wife    For last couple of days has a headache, lightheaded and dizzy.   Looks at ground and everything is out of focus.     2. LIGHTHEADED: "Do you feel lightheaded?" (e.g., somewhat faint, woozy, weak upon standing)     No passing. 3. VERTIGO: "Do you feel like either you or the room is spinning or tilting?" (i.e. vertigo)     Yes had vertigo before.    Usually has high BP.   He has been to ED for high BP in the past.     My BP machine stopped working.     4. SEVERITY: "How bad is it?"  "Do you feel like you are going to faint?" "Can you stand and walk?"   - MILD: Feels slightly dizzy, but walking normally.   - MODERATE: Feels unsteady when walking, but not falling; interferes with normal activities (e.g., school, work).   - SEVERE: Unable to walk without falling, or requires assistance to walk without falling; feels like passing out now.      He walks around without holding on to anything.    No falls.    5. ONSET:  "When did the dizziness begin?"     A couple of days now.   He has a fast heart rhythm and vertigo. 6. AGGRAVATING FACTORS: "Does anything make it worse?" (e.g., standing, change in head position)      These dizzy spells are intermittent.   Feels different than past vertigo. 7. HEART RATE: "Can you tell me your heart rate?" "How many beats in 15 seconds?"  (Note: not all patients can do this)       He gets a fast heart beat at times.      8. CAUSE: "What do you think is causing the dizziness?"     I'm not sure.   He has a history of vertigo and high BP.   Can't check it. 9. RECURRENT SYMPTOM: "Have you had dizziness before?" If Yes, ask: "When was the last time?" "What happened that time?"     Yes vertigo 10. OTHER SYMPTOMS: "Do you have any other symptoms?" (e.g., fever, chest pain, vomiting, diarrhea, bleeding)       Headache, dizzy 11. PREGNANCY: "Is there any chance you are pregnant?" "When was your last menstrual period?"       N/A  Protocols used: Dizziness - Lightheadedness-A-AH

## 2022-02-22 NOTE — Telephone Encounter (Signed)
  Chief Complaint: Spoke with wife Drago Hammonds (On DPR)   He has been dizzy, having a headache  Symptoms: dizziness, headache, lightheaded.   Has a history of vertigo.  He has had this before. Frequency: For last couple of days.  Wife calling because he doesn't want to come in and refuses to go to the ED.   "I want to get him scheduled even if I have to drive him myself".     Pertinent Negatives: Patient denies passing out or falling per wife. Disposition: [] ED /[] Urgent Care (no appt availability in office) / [x] Appointment(In office/virtual)/ []  [facility]/ [] Home Care/ [] Refused Recommended Disposition /[] [facility]/ []  Follow-up with PCP Additional Notes: I instructed Hillary that if he passes out of gets worse he needs to go to the ED for sure.   I got him scheduled with Erin Mecum, PA-C for 02/24/2022 at 8:20.

## 2022-02-24 ENCOUNTER — Ambulatory Visit: Payer: Medicaid Other | Admitting: Physician Assistant

## 2022-03-04 ENCOUNTER — Encounter: Payer: Self-pay | Admitting: Physical Medicine & Rehabilitation

## 2022-03-04 ENCOUNTER — Encounter: Payer: Medicaid Other | Attending: Physical Medicine & Rehabilitation | Admitting: Physical Medicine & Rehabilitation

## 2022-03-04 VITALS — BP 140/80 | HR 73 | Ht 70.0 in | Wt 282.0 lb

## 2022-03-04 DIAGNOSIS — M5136 Other intervertebral disc degeneration, lumbar region: Secondary | ICD-10-CM

## 2022-03-04 DIAGNOSIS — M47816 Spondylosis without myelopathy or radiculopathy, lumbar region: Secondary | ICD-10-CM | POA: Diagnosis not present

## 2022-03-04 NOTE — Progress Notes (Signed)
Subjective:    Patient ID: Paul [name], male    DOB: 1970/05/05, 52 y.o.   MRN: [id number]  HPI 52 year old male with 5-year history of low back pain.  He initially had some numbness in his legs but no longer complains of this.  He denies any weakness in the lower extremities.  No bowel or bladder dysfunction.  No fever or weight loss. Since being on the meloxicam he is now able to tie his shoes without difficulty.  On a day-to-day basis his back pain is pretty well-controlled.  He did have a flareup after he cut up a falling tree on his property with a chainsaw.  He states that was a pretty big job and did bending for many hours. The patient has been followed in this clinic since 05/14/2020, has been on meloxicam 15 mg/day since 06/12/2020. He has had no GI upset.  Reviewed most recent basic metabolic package in September 2023 which showed normal kidney functions.  CLINICAL DATA:  Low back pain and numbness for 2 years.   EXAM: MRI LUMBAR SPINE WITHOUT CONTRAST   TECHNIQUE: Multiplanar, multisequence MR imaging of the lumbar spine was performed. No intravenous contrast was administered.   COMPARISON:  None.   FINDINGS: Segmentation:  Standard.   Alignment:  Maintained.   Vertebrae: No fracture, evidence of discitis, or bone lesion. Scattered, mild degenerative endplate signal change noted.   Conus medullaris and cauda equina: Conus extends to the L1 level. Conus and cauda equina appear normal.   Paraspinal and other soft tissues: Negative.   Disc levels:   T12-L1 is imaged in the sagittal plane only and negative.   L1-2: Negative.   L2-3: Negative.   L3-4: Shallow disc bulge without stenosis.   L4-5: Shallow disc bulge and mild facet degenerative change without stenosis.   L5-S1: Shallow disc bulge without stenosis.   IMPRESSION: Mild lumbar degenerative change without central canal or foraminal stenosis.     Electronically Signed   By: Paul [name] M.D.    On: 12/28/2019 15:58 Pain Inventory Average Pain 7 Pain Right Now 4 My pain is intermittent, constant, sharp, and aching  In the last 24 hours, has pain interfered with the following? General activity 8 Relation with others 0 Enjoyment of life 5 What TIME of day is your pain at its worst? evening Sleep (in general) Good  Pain is worse with: standing Pain improves with: rest and heat Relief from Meds: 7  Family History  Problem Relation Age of Onset   Healthy Mother    Cancer Father    Lung cancer Father    Social History   Socioeconomic History   Marital status: Married    Spouse name: Not on file   Number of children: Not on file   Years of education: Not on file   Highest education level: Not on file  Occupational History   Occupation: unemployed  Tobacco Use   Smoking status: Never    Passive exposure: Never   Smokeless tobacco: Never  Vaping Use   Vaping Use: Never used  Substance and Sexual Activity   Alcohol use: Yes    Alcohol/week: 18.0 standard drinks of alcohol    Types: 18 Cans of beer per week   Drug use: Never   Sexual activity: Not Currently  Other Topics Concern   Not on file  Social History Narrative   Married for 8 months,second.Lives with wife and daughter.Moved from [place] in 2018.   Social Determinants of Health  Financial Resource Strain: Not on file  Food Insecurity: Not on file  Transportation Needs: Not on file  Physical Activity: Not on file  Stress: Not on file  Social Connections: Not on file   Past Surgical History:  Procedure Laterality Date   APPENDECTOMY     HERNIA REPAIR     Past Surgical History:  Procedure Laterality Date   APPENDECTOMY     HERNIA REPAIR     Past Medical History:  Diagnosis Date   Hypertension    Primary hypertension 12/11/2020   Vitamin D deficiency    There were no vitals taken for this visit.  Opioid Risk Score:   Fall Risk Score:  `1  Depression screen [facility] 2/9     10/05/2021     8:25 AM 04/01/2021    8:43 AM 03/04/2021   10:37 AM 12/11/2020    8:49 AM 11/26/2020   10:40 AM 07/28/2020    2:40 PM 06/12/2020    9:38 AM  Depression screen PHQ 2/9  Decreased Interest 0 0 0 0 0 0 0  Down, Depressed, Hopeless 0 0 0 0 1 0 0  PHQ - 2 Score 0 0 0 0 1 0 0  Altered sleeping 0 0  1 0    Tired, decreased energy 0 1  0 0    Change in appetite 0 0  0 0    Feeling bad or failure about yourself  0 0  0 1    Trouble concentrating 0 0  0 0    Moving slowly or fidgety/restless 0 0  0 0    Suicidal thoughts 0 0  0 0    PHQ-9 Score 0 '1  1 2    '$ Difficult doing work/chores Not difficult at all Not difficult at all  Not difficult at all        Review of Systems  Musculoskeletal:  Positive for back pain.  All other systems reviewed and are negative.      Objective:   Physical Exam  General no acute distress Mood and affect are appropriate Extremities without edema Lumbar spine without tenderness to palpation along the lumbar paraspinals from L1-L5.  No sacral pain to palpation No deformity noted. Lumbar range of motion is 75% flexion extension lateral bending and rotation.  No directional pain. Negative straight leg raising bilaterally Motor strength is 5/5 bilateral hip flexor knee extensor ankle dorsiflexor Sensation normal to light touch bilateral lower extremity       Assessment & Plan:  1.  Lumbar degenerative disc spondylosis, mild, no evidence of radiculopathy or myelopathy.  He does not have buttock pain on at least a daily basis.  He does have some episodic pain mainly activity related.  As discussed with patient do not think lumbar medial branch blocks or other spine intervention procedures are needed at this time.  Do not feel any further imaging needed at this time. He may continue on his meloxicam which is very helpful for him however cautioned him if he develops stomach upset to stop this.  Also recommend at least yearly kidney function test.  These can be  performed at PCP.  PCP office will start prescribing meloxicam after current prescription runs out which should be around June 2024. I be happy to see him back if his back pain worsens or becomes more consistent.

## 2022-03-04 NOTE — Patient Instructions (Signed)
No interventional procedures are needed at thsi time, your only back pain med is mobic which can be prescribed by your primary MD.  Need to check kidney function tests on a yearly basis, last labs in SEPT  2023 NORMAL .  Also take meloxicam with food and discontinue if you develop stomach pains or blood in stool.   Keep up with back exercises, may call if you develop shooting pains down the legs

## 2022-04-05 ENCOUNTER — Ambulatory Visit: Payer: Medicaid Other | Admitting: Nurse Practitioner

## 2022-04-05 DIAGNOSIS — M5136 Other intervertebral disc degeneration, lumbar region: Secondary | ICD-10-CM

## 2022-04-05 DIAGNOSIS — N522 Drug-induced erectile dysfunction: Secondary | ICD-10-CM

## 2022-04-05 DIAGNOSIS — Z114 Encounter for screening for human immunodeficiency virus [HIV]: Secondary | ICD-10-CM

## 2022-04-05 DIAGNOSIS — I10 Essential (primary) hypertension: Secondary | ICD-10-CM

## 2022-04-05 DIAGNOSIS — Z1159 Encounter for screening for other viral diseases: Secondary | ICD-10-CM

## 2022-04-05 DIAGNOSIS — E782 Mixed hyperlipidemia: Secondary | ICD-10-CM

## 2022-05-01 NOTE — Patient Instructions (Signed)

## 2022-05-03 ENCOUNTER — Encounter: Payer: Self-pay | Admitting: Nurse Practitioner

## 2022-05-03 ENCOUNTER — Ambulatory Visit (INDEPENDENT_AMBULATORY_CARE_PROVIDER_SITE_OTHER): Payer: Medicaid Other | Admitting: Nurse Practitioner

## 2022-05-03 VITALS — BP 138/78 | HR 61 | Temp 98.1°F | Ht 70.0 in | Wt 281.6 lb

## 2022-05-03 DIAGNOSIS — I10 Essential (primary) hypertension: Secondary | ICD-10-CM

## 2022-05-03 DIAGNOSIS — E782 Mixed hyperlipidemia: Secondary | ICD-10-CM | POA: Diagnosis not present

## 2022-05-03 DIAGNOSIS — Z114 Encounter for screening for human immunodeficiency virus [HIV]: Secondary | ICD-10-CM

## 2022-05-03 DIAGNOSIS — E559 Vitamin D deficiency, unspecified: Secondary | ICD-10-CM

## 2022-05-03 DIAGNOSIS — Z1159 Encounter for screening for other viral diseases: Secondary | ICD-10-CM

## 2022-05-03 DIAGNOSIS — Z1211 Encounter for screening for malignant neoplasm of colon: Secondary | ICD-10-CM | POA: Diagnosis not present

## 2022-05-03 DIAGNOSIS — M7918 Myalgia, other site: Secondary | ICD-10-CM

## 2022-05-03 DIAGNOSIS — N400 Enlarged prostate without lower urinary tract symptoms: Secondary | ICD-10-CM | POA: Diagnosis not present

## 2022-05-03 LAB — MICROALBUMIN, URINE WAIVED
Creatinine, Urine Waived: 50 mg/dL (ref 10–300)
Microalb, Ur Waived: 10 mg/L (ref 0–19)

## 2022-05-03 NOTE — Progress Notes (Signed)
BP 138/78 (BP Location: Left Arm, Patient Position: Sitting, Cuff Size: Large)   Pulse 61   Temp 98.1 F (36.7 C) (Oral)   Ht  (1.778 m)   Wt 281 lb 9.6 oz (127.7 kg)   SpO2 97%   BMI 40.41 kg/m    Subjective:    Patient ID: Paul [name], male    DOB: [birth year], 52 y.o.   MRN: [id number]  HPI: Paul [name] is a 52 y.o. male  Chief Complaint  Patient presents with   Hypertension   Hyperlipidemia   HYPERTENSION / HYPERLIPIDEMIA Currently taking Lisinopril-HCTZ 20-25 MG daily -- did not take medication this morning.  No statin.  Continues Vitamin D supplement for history of low levels.  No family history of stroke or heart attack.  Occasional beer intake at home - two times week.  Satisfied with current treatment? yes Duration of hypertension: chronic BP monitoring frequency: monthly BP range: does not remember BP medication side effects: no Duration of hyperlipidemia: chronic Cholesterol medication side effects: no Cholesterol supplements: none Medication compliance: good compliance Aspirin: no Recent stressors: no Recurrent headaches: no Visual changes: no Palpitations: no Dyspnea: no Chest pain: no Lower extremity edema: no Dizzy/lightheaded: no  The 10-year ASCVD risk score (Arnett DK, et al., 2019) is: 6.1%   Values used to calculate the score:     Age: 64 years     Sex: Male     Is Non-Hispanic African American: No     Diabetic: No     Tobacco smoker: No     Systolic Blood Pressure: 138 mmHg     Is BP treated: Yes     HDL Cholesterol: 44 mg/dL     Total Cholesterol: 180 mg/dL   Relevant past medical, surgical, family and social history reviewed and updated as indicated. Interim medical history since our last visit reviewed. Allergies and medications reviewed and updated.  Review of Systems  Constitutional:  Negative for activity change, diaphoresis, fatigue and fever.  Respiratory:  Negative for cough, chest tightness, shortness of breath  and wheezing.   Cardiovascular:  Negative for chest pain, palpitations and leg swelling.  Gastrointestinal: Negative.   Endocrine: Negative for polydipsia, polyphagia and polyuria.  Neurological: Negative.   Psychiatric/Behavioral: Negative.      Per HPI unless specifically indicated above     Objective:    BP 138/78 (BP Location: Left Arm, Patient Position: Sitting, Cuff Size: Large)   Pulse 61   Temp 98.1 F (36.7 C) (Oral)   Ht  (1.778 m)   Wt 281 lb 9.6 oz (127.7 kg)   SpO2 97%   BMI 40.41 kg/m   Wt Readings from Last 3 Encounters:  05/03/22 281 lb 9.6 oz (127.7 kg)  03/04/22 282 lb (127.9 kg)  12/23/21 282 lb (127.9 kg)    Physical Exam Vitals and nursing note reviewed.  Constitutional:      General: He is awake. He is not in acute distress.    Appearance: He is well-developed and well-groomed. He is obese. He is not ill-appearing or toxic-appearing.  HENT:     Head: Normocephalic.     Right Ear: Hearing and external ear normal.     Left Ear: Hearing and external ear normal.  Eyes:     General: Lids are normal.     Extraocular Movements: Extraocular movements intact.     Conjunctiva/sclera: Conjunctivae normal.  Neck:     Thyroid: No thyromegaly.     Vascular: No carotid  bruit.  Cardiovascular:     Rate and Rhythm: Normal rate and regular rhythm.     Heart sounds: Normal heart sounds.  Pulmonary:     Effort: No accessory muscle usage or respiratory distress.     Breath sounds: Normal breath sounds.  Abdominal:     General: Bowel sounds are normal. There is no distension.     Palpations: Abdomen is soft.     Tenderness: There is no abdominal tenderness.  Musculoskeletal:     Cervical back: Full passive range of motion without pain.     Right lower leg: No edema.     Left lower leg: No edema.  Lymphadenopathy:     Cervical: No cervical adenopathy.  Skin:    General: Skin is warm.     Capillary Refill: Capillary refill takes less than 2 seconds.   Neurological:     Mental Status: He is alert and oriented to person, place, and time.     Deep Tendon Reflexes: Reflexes are normal and symmetric.     Reflex Scores:      Brachioradialis reflexes are 2+ on the right side and 2+ on the left side.      Patellar reflexes are 2+ on the right side and 2+ on the left side. Psychiatric:        Attention and Perception: Attention normal.        Mood and Affect: Mood normal.        Speech: Speech normal.        Behavior: Behavior normal. Behavior is cooperative.        Thought Content: Thought content normal.    Results for orders placed or performed in visit on 10/05/21  Lipid Profile  Result Value Ref Range   Cholesterol, Total 180 100 - 199 mg/dL   Triglycerides 161 (H) 0 - 149 mg/dL   HDL 44 >09 mg/dL   VLDL Cholesterol Cal 31 5 - 40 mg/dL   LDL Chol Calc (NIH) 604 (H) 0 - 99 mg/dL   Chol/HDL Ratio 4.1 0.0 - 5.0 ratio  Comp Met (CMET)  Result Value Ref Range   Glucose 128 (H) 70 - 99 mg/dL   BUN 16 6 - 24 mg/dL   Creatinine, Ser 5.40 0.76 - 1.27 mg/dL   eGFR 92 >98 JX/BJY/7.82   BUN/Creatinine Ratio 16 9 - 20   Sodium 135 134 - 144 mmol/L   Potassium 4.2 3.5 - 5.2 mmol/L   Chloride 98 96 - 106 mmol/L   CO2 21 20 - 29 mmol/L   Calcium 9.5 8.7 - 10.2 mg/dL   Total Protein 7.1 6.0 - 8.5 g/dL   Albumin 4.5 3.8 - 4.9 g/dL   Globulin, Total 2.6 1.5 - 4.5 g/dL   Albumin/Globulin Ratio 1.7 1.2 - 2.2   Bilirubin Total 0.5 0.0 - 1.2 mg/dL   Alkaline Phosphatase 77 44 - 121 IU/L   AST 23 0 - 40 IU/L   ALT 35 0 - 44 IU/L  CBC w/Diff  Result Value Ref Range   WBC 3.7 3.4 - 10.8 x10E3/uL   RBC 4.97 4.14 - 5.80 x10E6/uL   Hemoglobin 15.2 13.0 - 17.7 g/dL   Hematocrit 95.6 21.3 - 51.0 %   MCV 89 79 - 97 fL   MCH 30.6 26.6 - 33.0 pg   MCHC 34.2 31.5 - 35.7 g/dL   RDW 08.6 57.8 - 46.9 %   Platelets 152 150 - 450 x10E3/uL   Neutrophils 58 Not Estab. %  Lymphs 30 Not Estab. %   Monocytes 8 Not Estab. %   Eos 3 Not Estab. %   Basos  1 Not Estab. %   Neutrophils Absolute 2.2 1.4 - 7.0 x10E3/uL   Lymphocytes Absolute 1.1 0.7 - 3.1 x10E3/uL   Monocytes Absolute 0.3 0.1 - 0.9 x10E3/uL   EOS (ABSOLUTE) 0.1 0.0 - 0.4 x10E3/uL   Basophils Absolute 0.0 0.0 - 0.2 x10E3/uL   Immature Granulocytes 0 Not Estab. %   Immature Grans (Abs) 0.0 0.0 - 0.1 x10E3/uL  TSH  Result Value Ref Range   TSH 1.840 0.450 - 4.500 uIU/mL      Assessment & Plan:   Problem List Items Addressed This Visit       Cardiovascular and Mediastinum   Essential hypertension, benign - Primary    Chronic, ongoing.  Initial BP elevated, but repeat at goal.  Had not taken medication this morning.  Recommend he monitor BP at least a few mornings a week at home and document for visits.  DASH diet at home.  Continue current medication regimen and adjust as needed.  Labs today: CMP and urine ALB.  Return in 6 months.       Relevant Orders   Microalbumin, Urine Waived     Other   Hyperlipidemia    Chronic, ongoing.  LDL 105 recent labs and ASCVD 6.1%.  Showed him ASCVD score and guideline chart.  Will recheck today and if ongoing elevation start low dose Rosuvastatin 10 MG, then adjust as needed.  Discussed at length with him.         Relevant Orders   Comprehensive metabolic panel   Lipid Panel w/o Chol/HDL Ratio   Vitamin D deficiency    Chronic, ongoing.  Recheck level today.  Continue supplement and adjust as needed.      Relevant Orders   VITAMIN D 25 Hydroxy (Vit-D Deficiency, Fractures)   Other Visit Diagnoses     Benign prostatic hyperplasia without lower urinary tract symptoms       PSA on labs today.   Relevant Orders   PSA   Need for hepatitis C screening test       Hep C screen on labs today per guidelines for one time screening, discussed with patient.   Relevant Orders   Hepatitis C antibody   Encounter for screening for HIV       HIV screen on labs today per guidelines for one time screening, discussed with patient.   Relevant  Orders   HIV Antibody (routine testing w rflx)   Colon cancer screening       Cologuard ordered today.   Relevant Orders   Cologuard        Follow up plan: Return in about 6 months (around 11/02/2022) for HTN/HLD.

## 2022-05-03 NOTE — Assessment & Plan Note (Signed)
Chronic, ongoing.  LDL 105 recent labs and ASCVD 6.1%.  Showed him ASCVD score and guideline chart.  Will recheck today and if ongoing elevation start low dose Rosuvastatin 10 MG, then adjust as needed.  Discussed at length with him.

## 2022-05-03 NOTE — Assessment & Plan Note (Addendum)
Chronic, ongoing.  Initial BP elevated, but repeat at goal.  Had not taken medication this morning.  Recommend he monitor BP at least a few mornings a week at home and document for visits.  DASH diet at home.  Continue current medication regimen and adjust as needed.  Labs today: CMP and urine ALB.  Return in 6 months.

## 2022-05-03 NOTE — Assessment & Plan Note (Signed)
Chronic, ongoing.  Recheck level today.  Continue supplement and adjust as needed.

## 2022-05-04 LAB — COMPREHENSIVE METABOLIC PANEL
ALT: 31 IU/L (ref 0–44)
AST: 21 IU/L (ref 0–40)
Albumin/Globulin Ratio: 1.9 (ref 1.2–2.2)
Albumin: 4.7 g/dL (ref 3.8–4.9)
Alkaline Phosphatase: 90 IU/L (ref 44–121)
BUN/Creatinine Ratio: 15 (ref 9–20)
BUN: 14 mg/dL (ref 6–24)
Bilirubin Total: 0.6 mg/dL (ref 0.0–1.2)
CO2: 22 mmol/L (ref 20–29)
Calcium: 9.3 mg/dL (ref 8.7–10.2)
Chloride: 99 mmol/L (ref 96–106)
Creatinine, Ser: 0.93 mg/dL (ref 0.76–1.27)
Globulin, Total: 2.5 g/dL (ref 1.5–4.5)
Glucose: 104 mg/dL — ABNORMAL HIGH (ref 70–99)
Potassium: 4.3 mmol/L (ref 3.5–5.2)
Sodium: 135 mmol/L (ref 134–144)
Total Protein: 7.2 g/dL (ref 6.0–8.5)
eGFR: 98 mL/min/{1.73_m2} (ref >59)

## 2022-05-04 LAB — LIPID PANEL W/O CHOL/HDL RATIO
Cholesterol, Total: 164 mg/dL (ref 100–199)
HDL: 44 mg/dL (ref >39)
LDL Chol Calc (NIH): 95 mg/dL (ref 0–99)
Triglycerides: 141 mg/dL (ref 0–149)
VLDL Cholesterol Cal: 25 mg/dL (ref 5–40)

## 2022-05-04 LAB — VITAMIN D 25 HYDROXY (VIT D DEFICIENCY, FRACTURES): Vit D, 25-Hydroxy: 27.2 ng/mL — ABNORMAL LOW (ref 30.0–100.0)

## 2022-05-04 LAB — HEPATITIS C ANTIBODY: Hep C Virus Ab: NONREACTIVE

## 2022-05-04 LAB — PSA: Prostate Specific Ag, Serum: 1.3 ng/mL (ref 0.0–4.0)

## 2022-05-04 LAB — HIV ANTIBODY (ROUTINE TESTING W REFLEX): HIV Screen 4th Generation wRfx: NONREACTIVE

## 2022-05-04 NOTE — Progress Notes (Signed)
Contacted via MyChart The 10-year ASCVD risk score (Arnett DK, et al., 2019) is: 5.5%   Values used to calculate the score:     Age: 52 years     Sex: Male     Is Non-Hispanic African American: No     Diabetic: No     Tobacco smoker: No     Systolic Blood Pressure: 138 mmHg     Is BP treated: Yes     HDL Cholesterol: 44 mg/dL     Total Cholesterol: 164 mg/dL   Good evening Jaryd, your labs have returned: - Kidney function, creatinine and eGFR, remains normal, as is liver function, AST and ALT.  - Vitamin D level a little low, definitely recommend you ensure yo take supplement as currently reported. - Cholesterol levels trending down this check.  We do not need to start medication at this time as we discussed, but highly recommend heavy focus on healthy diet changes and regular exercise.   - Hep C and HIV are negative.  Any questions? Keep being awesome!!  Thank you for allowing me to participate in your care.  I appreciate you. Kindest regards, Jaila Schellhorn

## 2022-06-12 DIAGNOSIS — Z1211 Encounter for screening for malignant neoplasm of colon: Secondary | ICD-10-CM | POA: Diagnosis not present

## 2022-06-19 LAB — COLOGUARD: COLOGUARD: NEGATIVE

## 2022-06-19 NOTE — Progress Notes (Signed)
Contacted via MyChart   Good news, your Cologuard returned negative.  Do not need to repeat for 3 years.

## 2022-07-21 ENCOUNTER — Other Ambulatory Visit: Payer: Self-pay | Admitting: Physical Medicine & Rehabilitation

## 2022-11-02 ENCOUNTER — Ambulatory Visit: Payer: Medicaid Other | Admitting: Nurse Practitioner

## 2022-11-02 DIAGNOSIS — E782 Mixed hyperlipidemia: Secondary | ICD-10-CM

## 2022-11-02 DIAGNOSIS — I10 Essential (primary) hypertension: Secondary | ICD-10-CM

## 2022-11-06 NOTE — Patient Instructions (Signed)
Be Involved in Caring For Your Health:  Taking Medications When medications are taken as directed, they can greatly improve your health. But if they are not taken as prescribed, they may not work. In some cases, not taking them correctly can be harmful. To help ensure your treatment remains effective and safe, understand your medications and how to take them. Bring your medications to each visit for review by your provider.  Your lab results, notes, and after visit summary will be available on My Chart. We strongly encourage you to use this feature. If lab results are abnormal the clinic will contact you with the appropriate steps. If the clinic does not contact you assume the results are satisfactory. You can always view your results on My Chart. If you have questions regarding your health or results, please contact the clinic during office hours. You can also ask questions on My Chart.  We at [facility] are grateful that you chose us to provide your care. We strive to provide evidence-based and compassionate care and are always looking for feedback. If you get a survey from the clinic please complete this so we can hear your opinions.  DASH Eating Plan DASH stands for Dietary Approaches to Stop Hypertension. The DASH eating plan is a healthy eating plan that has been shown to: Lower high blood pressure (hypertension). Reduce your risk for type 2 diabetes, heart disease, and stroke. Help with weight loss. What are tips for following this plan? Reading food labels Check food labels for the amount of salt (sodium) per serving. Choose foods with less than 5 percent of the Daily Value (DV) of sodium. In general, foods with less than 300 milligrams (mg) of sodium per serving fit into this eating plan. To find whole grains, look for the word "whole" as the first word in the ingredient list. Shopping Buy products labeled as "low-sodium" or "no salt added." Buy fresh foods. Avoid canned  foods and pre-made or frozen meals. Cooking Try not to add salt when you cook. Use salt-free seasonings or herbs instead of table salt or sea salt. Check with your health care provider or pharmacist before using salt substitutes. Do not fry foods. Cook foods in healthy ways, such as baking, boiling, grilling, roasting, or broiling. Cook using oils that are good for your heart. These include olive, canola, avocado, soybean, and sunflower oil. Meal planning  Eat a balanced diet. This should include: 4 or more servings of fruits and 4 or more servings of vegetables each day. Try to fill half of your plate with fruits and vegetables. 6-8 servings of whole grains each day. 6 or less servings of lean meat, poultry, or fish each day. 1 oz is 1 serving. A 3 oz (85 g) serving of meat is about the same size as the palm of your hand. One egg is 1 oz (28 g). 2-3 servings of low-fat dairy each day. One serving is 1 cup (237 mL). 1 serving of nuts, seeds, or beans 5 times each week. 2-3 servings of heart-healthy fats. Healthy fats called omega-3 fatty acids are found in foods such as walnuts, flaxseeds, fortified milks, and eggs. These fats are also found in cold-water fish, such as sardines, salmon, and mackerel. Limit how much you eat of: Canned or prepackaged foods. Food that is high in trans fat, such as fried foods. Food that is high in saturated fat, such as fatty meat. Desserts and other sweets, sugary drinks, and other foods with added sugar. Full-fat   dairy products. Do not salt foods before eating. Do not eat more than 4 egg yolks a week. Try to eat at least 2 vegetarian meals a week. Eat more home-cooked food and less restaurant, buffet, and fast food. Lifestyle When eating at a restaurant, ask if your food can be made with less salt or no salt. If you drink alcohol: Limit how much you have to: 0-1 drink a day if you are male. 0-2 drinks a day if you are male. Know how much alcohol is in  your drink. In the [place] one drink is one 12 oz bottle of beer (355 mL), one 5 oz glass of wine (148 mL), or one 1 oz glass of hard liquor (44 mL). General information Avoid eating more than 2,300 mg of salt a day. If you have hypertension, you may need to reduce your sodium intake to 1,500 mg a day. Work with your provider to stay at a healthy body weight or lose weight. Ask what the best weight range is for you. On most days of the week, get at least 30 minutes of exercise that causes your heart to beat faster. This may include walking, swimming, or biking. Work with your provider or dietitian to adjust your eating plan to meet your specific calorie needs. What foods should I eat? Fruits All fresh, dried, or frozen fruit. Canned fruits that are in their natural juice and do not have sugar added to them. Vegetables Fresh or frozen vegetables that are raw, steamed, roasted, or grilled. Low-sodium or reduced-sodium tomato and vegetable juice. Low-sodium or reduced-sodium tomato sauce and tomato paste. Low-sodium or reduced-sodium canned vegetables. Grains Whole-grain or whole-wheat bread. Whole-grain or whole-wheat pasta. Brown rice. Oatmeal. Quinoa. Bulgur. Whole-grain and low-sodium cereals. Pita bread. Low-fat, low-sodium crackers. Whole-wheat flour tortillas. Meats and other proteins Skinless chicken or turkey. Ground chicken or turkey. Pork with fat trimmed off. Fish and seafood. Egg whites. Dried beans, peas, or lentils. Unsalted nuts, nut butters, and seeds. Unsalted canned beans. Lean cuts of beef with fat trimmed off. Low-sodium, lean precooked or cured meat, such as sausages or meat loaves. Dairy Low-fat (1%) or fat-free (skim) milk. Reduced-fat, low-fat, or fat-free cheeses. Nonfat, low-sodium ricotta or cottage cheese. Low-fat or nonfat yogurt. Low-fat, low-sodium cheese. Fats and oils Soft margarine without trans fats. Vegetable oil. Reduced-fat, low-fat, or light mayonnaise and salad  dressings (reduced-sodium). Canola, safflower, olive, avocado, soybean, and sunflower oils. Avocado. Seasonings and condiments Herbs. Spices. Seasoning mixes without salt. Other foods Unsalted popcorn and pretzels. Fat-free sweets. The items listed above may not be all the foods and drinks you can have. Talk to a dietitian to learn more. What foods should I avoid? Fruits Canned fruit in a light or heavy syrup. Fried fruit. Fruit in cream or butter sauce. Vegetables Creamed or fried vegetables. Vegetables in a cheese sauce. Regular canned vegetables that are not marked as low-sodium or reduced-sodium. Regular canned tomato sauce and paste that are not marked as low-sodium or reduced-sodium. Regular tomato and vegetable juices that are not marked as low-sodium or reduced-sodium. Pickles. Olives. Grains Baked goods made with fat, such as croissants, muffins, or some breads. Dry pasta or rice meal packs. Meats and other proteins Fatty cuts of meat. Ribs. Fried meat. Bacon. Bologna, salami, and other precooked or cured meats, such as sausages or meat loaves, that are not lean and low in sodium. Fat from the back of a pig (fatback). Bratwurst. Salted nuts and seeds. Canned beans with added salt. Canned   or smoked fish. Whole eggs or egg yolks. Chicken or turkey with skin. Dairy Whole or 2% milk, cream, and half-and-half. Whole or full-fat cream cheese. Whole-fat or sweetened yogurt. Full-fat cheese. Nondairy creamers. Whipped toppings. Processed cheese and cheese spreads. Fats and oils Butter. Stick margarine. Lard. Shortening. Ghee. Bacon fat. Tropical oils, such as coconut, palm kernel, or palm oil. Seasonings and condiments Onion salt, garlic salt, seasoned salt, table salt, and sea salt. Worcestershire sauce. Tartar sauce. Barbecue sauce. Teriyaki sauce. Soy sauce, including reduced-sodium soy sauce. Steak sauce. Canned and packaged gravies. Fish sauce. Oyster sauce. Cocktail sauce. Store-bought  horseradish. Ketchup. Mustard. Meat flavorings and tenderizers. Bouillon cubes. Hot sauces. Pre-made or packaged marinades. Pre-made or packaged taco seasonings. Relishes. Regular salad dressings. Other foods Salted popcorn and pretzels. The items listed above may not be all the foods and drinks you should avoid. Talk to a dietitian to learn more. Where to find more information National Heart, Lung, and Blood Institute (NHLBI): nhlbi.nih.gov American Heart Association (AHA): heart.org Academy of Nutrition and Dietetics: eatright.org National Kidney Foundation (NKF): kidney.org This information is not intended to replace advice given to you by your health care provider. Make sure you discuss any questions you have with your health care provider. Document Revised: 01/13/2022 Document Reviewed: 01/13/2022 Elsevier Patient Education  2024 Elsevier Inc.  

## 2022-11-08 ENCOUNTER — Encounter: Payer: Self-pay | Admitting: Nurse Practitioner

## 2022-11-08 ENCOUNTER — Ambulatory Visit (INDEPENDENT_AMBULATORY_CARE_PROVIDER_SITE_OTHER): Payer: Medicaid Other | Admitting: Nurse Practitioner

## 2022-11-08 VITALS — BP 134/68 | HR 66 | Temp 98.0°F | Ht 70.0 in | Wt 279.0 lb

## 2022-11-08 DIAGNOSIS — E782 Mixed hyperlipidemia: Secondary | ICD-10-CM | POA: Diagnosis not present

## 2022-11-08 DIAGNOSIS — I10 Essential (primary) hypertension: Secondary | ICD-10-CM

## 2022-11-08 NOTE — Progress Notes (Signed)
BP 134/68 (BP Location: Left Arm, Patient Position: Sitting, Cuff Size: Large)   Pulse 66   Temp 98 F (36.7 C) (Oral)   Ht 5\' 10"  (1.778 m)   Wt 279 lb (126.6 kg)   SpO2 98%   BMI 40.03 kg/m    Subjective:    Patient ID: Paul [name], male    DOB: 10-31-1970, 52 y.o.   MRN: [id number]  HPI: Paul [name] is a 52 y.o. male  Chief Complaint  Patient presents with   Hyperlipidemia   Hypertension   HYPERTENSION / HYPERLIPIDEMIA Continues on Lisinopril-hydrochlorothiazide.  No current statin therapy. Satisfied with current treatment? yes Duration of hypertension: chronic BP monitoring frequency: not checking BP range:  BP medication side effects: no Duration of hyperlipidemia: chronic Aspirin: no Recent stressors: no Recurrent headaches: no Visual changes: no Palpitations: no Dyspnea: no Chest pain: no Lower extremity edema: no Dizzy/lightheaded: no  The 10-year ASCVD risk score (Arnett DK, et al., 2019) is: 5.2%   Values used to calculate the score:     Age: 85 years     Sex: Male     Is Non-Hispanic African American: No     Diabetic: No     Tobacco smoker: No     Systolic Blood Pressure: 134 mmHg     Is BP treated: Yes     HDL Cholesterol: 44 mg/dL     Total Cholesterol: 164 mg/dL   Relevant past medical, surgical, family and social history reviewed and updated as indicated. Interim medical history since our last visit reviewed. Allergies and medications reviewed and updated.  Review of Systems  Constitutional:  Negative for activity change, diaphoresis, fatigue and fever.  Respiratory:  Negative for cough, chest tightness, shortness of breath and wheezing.   Cardiovascular:  Negative for chest pain, palpitations and leg swelling.  Gastrointestinal: Negative.   Endocrine: Negative for polydipsia, polyphagia and polyuria.  Neurological: Negative.   Psychiatric/Behavioral: Negative.      Per HPI unless specifically indicated above     Objective:     BP 134/68 (BP Location: Left Arm, Patient Position: Sitting, Cuff Size: Large)   Pulse 66   Temp 98 F (36.7 C) (Oral)   Ht 5\' 10"  (1.778 m)   Wt 279 lb (126.6 kg)   SpO2 98%   BMI 40.03 kg/m   Wt Readings from Last 3 Encounters:  11/08/22 279 lb (126.6 kg)  05/03/22 281 lb 9.6 oz (127.7 kg)  03/04/22 282 lb (127.9 kg)    Physical Exam Vitals and nursing note reviewed.  Constitutional:      General: He is awake. He is not in acute distress.    Appearance: He is well-developed and well-groomed. He is obese. He is not ill-appearing or toxic-appearing.  HENT:     Head: Normocephalic.     Right Ear: Hearing and external ear normal.     Left Ear: Hearing and external ear normal.  Eyes:     General: Lids are normal.     Extraocular Movements: Extraocular movements intact.     Conjunctiva/sclera: Conjunctivae normal.  Neck:     Thyroid: No thyromegaly.     Vascular: No carotid bruit.  Cardiovascular:     Rate and Rhythm: Normal rate and regular rhythm.     Heart sounds: Normal heart sounds.  Pulmonary:     Effort: No accessory muscle usage or respiratory distress.     Breath sounds: Normal breath sounds.  Abdominal:     General: Bowel  sounds are normal. There is no distension.     Palpations: Abdomen is soft.     Tenderness: There is no abdominal tenderness.  Musculoskeletal:     Cervical back: Full passive range of motion without pain.     Right lower leg: No edema.     Left lower leg: No edema.  Lymphadenopathy:     Cervical: No cervical adenopathy.  Skin:    General: Skin is warm.     Capillary Refill: Capillary refill takes less than 2 seconds.  Neurological:     Mental Status: He is alert and oriented to person, place, and time.     Deep Tendon Reflexes: Reflexes are normal and symmetric.     Reflex Scores:      Brachioradialis reflexes are 2+ on the right side and 2+ on the left side.      Patellar reflexes are 2+ on the right side and 2+ on the left  side. Psychiatric:        Attention and Perception: Attention normal.        Mood and Affect: Mood normal.        Speech: Speech normal.        Behavior: Behavior normal. Behavior is cooperative.        Thought Content: Thought content normal.    Results for orders placed or performed in visit on 05/03/22  Microalbumin, Urine Waived  Result Value Ref Range   Microalb, Ur Waived 10 0 - 19 mg/L   Creatinine, Urine Waived 50 10 - 300 mg/dL   Microalb/Creat Ratio 30-300 (H) <30 mg/g  Comprehensive metabolic panel  Result Value Ref Range   Glucose 104 (H) 70 - 99 mg/dL   BUN 14 6 - 24 mg/dL   Creatinine, Ser 1.91 0.76 - 1.27 mg/dL   eGFR 98 >47 WG/NFA/2.13   BUN/Creatinine Ratio 15 9 - 20   Sodium 135 134 - 144 mmol/L   Potassium 4.3 3.5 - 5.2 mmol/L   Chloride 99 96 - 106 mmol/L   CO2 22 20 - 29 mmol/L   Calcium 9.3 8.7 - 10.2 mg/dL   Total Protein 7.2 6.0 - 8.5 g/dL   Albumin 4.7 3.8 - 4.9 g/dL   Globulin, Total 2.5 1.5 - 4.5 g/dL   Albumin/Globulin Ratio 1.9 1.2 - 2.2   Bilirubin Total 0.6 0.0 - 1.2 mg/dL   Alkaline Phosphatase 90 44 - 121 IU/L   AST 21 0 - 40 IU/L   ALT 31 0 - 44 IU/L  Lipid Panel w/o Chol/HDL Ratio  Result Value Ref Range   Cholesterol, Total 164 100 - 199 mg/dL   Triglycerides 086 0 - 149 mg/dL   HDL 44 >57 mg/dL   VLDL Cholesterol Cal 25 5 - 40 mg/dL   LDL Chol Calc (NIH) 95 0 - 99 mg/dL  PSA  Result Value Ref Range   Prostate Specific Ag, Serum 1.3 0.0 - 4.0 ng/mL  Hepatitis C antibody  Result Value Ref Range   Hep C Virus Ab Non Reactive Non Reactive  HIV Antibody (routine testing w rflx)  Result Value Ref Range   HIV Screen 4th Generation wRfx Non Reactive Non Reactive  VITAMIN D 25 Hydroxy (Vit-D Deficiency, Fractures)  Result Value Ref Range   Vit D, 25-Hydroxy 27.2 (L) 30.0 - 100.0 ng/mL  Cologuard  Result Value Ref Range   COLOGUARD Negative Negative      Assessment & Plan:   Problem List Items Addressed This Visit  Cardiovascular and Mediastinum   Essential hypertension, benign - Primary    Chronic, ongoing.  BP remaining stable at this time.  Recommend he monitor BP at least a few mornings a week at home and document for visits.  DASH diet at home.  Continue current medication regimen and adjust as needed.  Labs today: CMP.  Return in 6 months.       Relevant Orders   Comprehensive metabolic panel     Other   Hyperlipidemia    Chronic, ongoing.  LDL 95 recent labs and ASCVD 5.2%.  Showed him ASCVD score and guideline chart.  Will recheck today and if elevations consider starting low dose Rosuvastatin 10 MG, then adjust as needed.  Discussed at length with him.         Relevant Orders   Comprehensive metabolic panel   Lipid Panel w/o Chol/HDL Ratio     Follow up plan: Return in about 6 months (around 05/09/2023) for Annual physical -- after 05/03/23.

## 2022-11-08 NOTE — Assessment & Plan Note (Signed)
Chronic, ongoing.  BP remaining stable at this time.  Recommend he monitor BP at least a few mornings a week at home and document for visits.  DASH diet at home.  Continue current medication regimen and adjust as needed.  Labs today: CMP.  Return in 6 months.

## 2022-11-08 NOTE — Assessment & Plan Note (Signed)
Chronic, ongoing.  LDL 95 recent labs and ASCVD 5.2%.  Showed him ASCVD score and guideline chart.  Will recheck today and if elevations consider starting low dose Rosuvastatin 10 MG, then adjust as needed.  Discussed at length with him.

## 2022-11-09 LAB — COMPREHENSIVE METABOLIC PANEL
ALT: 29 [IU]/L (ref 0–44)
AST: 23 [IU]/L (ref 0–40)
Albumin: 4.5 g/dL (ref 3.8–4.9)
Alkaline Phosphatase: 84 [IU]/L (ref 44–121)
BUN/Creatinine Ratio: 17 (ref 9–20)
BUN: 16 mg/dL (ref 6–24)
Bilirubin Total: 0.5 mg/dL (ref 0.0–1.2)
CO2: 24 mmol/L (ref 20–29)
Calcium: 9.3 mg/dL (ref 8.7–10.2)
Chloride: 101 mmol/L (ref 96–106)
Creatinine, Ser: 0.93 mg/dL (ref 0.76–1.27)
Globulin, Total: 2.4 g/dL (ref 1.5–4.5)
Glucose: 98 mg/dL (ref 70–99)
Potassium: 4.1 mmol/L (ref 3.5–5.2)
Sodium: 140 mmol/L (ref 134–144)
Total Protein: 6.9 g/dL (ref 6.0–8.5)
eGFR: 98 mL/min/{1.73_m2} (ref >59)

## 2022-11-09 LAB — LIPID PANEL W/O CHOL/HDL RATIO
Cholesterol, Total: 174 mg/dL (ref 100–199)
HDL: 41 mg/dL (ref >39)
LDL Chol Calc (NIH): 98 mg/dL (ref 0–99)
Triglycerides: 202 mg/dL — ABNORMAL HIGH (ref 0–149)
VLDL Cholesterol Cal: 35 mg/dL (ref 5–40)

## 2022-11-09 NOTE — Progress Notes (Signed)
Contacted via MyChart The 10-year ASCVD risk score (Arnett DK, et al., 2019) is: 6%   Values used to calculate the score:     Age: 52 years     Sex: Male     Is Non-Hispanic African American: No     Diabetic: No     Tobacco smoker: No     Systolic Blood Pressure: 134 mmHg     Is BP treated: Yes     HDL Cholesterol: 41 mg/dL     Total Cholesterol: 174 mg/dL   Good afternoon Taniela, your labs have returned: - Kidney function, creatinine and eGFR, remains normal, as is liver function, AST and ALT.  - LDL is staying in 90 range -- for now we can maintain diet focus but if it trends up we may need to discuss medication.  Any questions? Keep being awesome!!  Thank you for allowing me to participate in your care.  I appreciate you. Kindest regards, Kyrra Prada

## 2023-01-19 ENCOUNTER — Other Ambulatory Visit: Payer: Self-pay | Admitting: Nurse Practitioner

## 2023-01-23 NOTE — Telephone Encounter (Signed)
 Requested Prescriptions  Pending Prescriptions Disp Refills   lisinopril -hydrochlorothiazide  (ZESTORETIC ) 20-25 MG tablet [Pharmacy Med Name: LISINOPRIL -HCTZ 20/25MG  TABLETS] 90 tablet 3    Sig: TAKE 1 TABLET BY MOUTH DAILY     Cardiovascular:  ACEI + Diuretic Combos Passed - 01/23/2023 10:10 AM      Passed - Na in normal range and within 180 days    Sodium  Date Value Ref Range Status  11/08/2022 140 134 - 144 mmol/L Final         Passed - K in normal range and within 180 days    Potassium  Date Value Ref Range Status  11/08/2022 4.1 3.5 - 5.2 mmol/L Final         Passed - Cr in normal range and within 180 days    Creat  Date Value Ref Range Status  02/19/2020 1.10 0.70 - 1.33 mg/dL Final    Comment:    For patients >71 years of age, the reference limit for Creatinine is approximately 13% higher for people identified as African-American. .    Creatinine, Ser  Date Value Ref Range Status  11/08/2022 0.93 0.76 - 1.27 mg/dL Final         Passed - eGFR is 30 or above and within 180 days    GFR, Est African American  Date Value Ref Range Status  02/19/2020 90 > OR = 60 mL/min/1.68m2 Final   GFR, Est Non African American  Date Value Ref Range Status  02/19/2020 77 > OR = 60 mL/min/1.27m2 Final   eGFR  Date Value Ref Range Status  11/08/2022 98 >59 mL/min/1.73 Final         Passed - Patient is not pregnant      Passed - Last BP in normal range    BP Readings from Last 1 Encounters:  11/08/22 134/68         Passed - Valid encounter within last 6 months    Recent Outpatient Visits           2 months ago Essential hypertension, benign   [facility] Klemme, Ladora T, NP   8 months ago Essential hypertension, benign   [place] [facility] [place], Melanie T, NP   1 year ago Hyperlipidemia, unspecified hyperlipidemia type   [place] [facility] Mecum, Rocky BRAVO, PA-C   1 year ago Hyperlipidemia, unspecified  hyperlipidemia type   [facility] Vigg, Avanti, MD   2 years ago Hyperlipidemia, unspecified hyperlipidemia type   [place] [facility] Vigg, Avanti, MD       Future Appointments             In 3 months Cannady, Jolene T, NP  [facility], [facility]

## 2023-01-29 ENCOUNTER — Other Ambulatory Visit: Payer: Self-pay | Admitting: Physical Medicine & Rehabilitation

## 2023-02-28 ENCOUNTER — Telehealth: Payer: Self-pay

## 2023-02-28 NOTE — Telephone Encounter (Signed)
 Patient called in asking for a refill on meloxicam, patient has not been seen in a year.

## 2023-03-03 ENCOUNTER — Ambulatory Visit: Payer: Self-pay | Admitting: Nurse Practitioner

## 2023-03-03 ENCOUNTER — Encounter: Payer: Self-pay | Admitting: Family Medicine

## 2023-03-03 ENCOUNTER — Telehealth: Payer: Medicaid Other | Admitting: Family Medicine

## 2023-03-03 NOTE — Progress Notes (Signed)
 This patient was not seen by me today.  He was inadvertently set up for appointment with me and it was later canceled.  Signed:  Arletha Lady, MD           06/21/2023

## 2023-03-03 NOTE — Telephone Encounter (Signed)
 FYI to provider. Patient has appointment scheduled with [facility] this afternoon.

## 2023-03-03 NOTE — Telephone Encounter (Signed)
 Noted

## 2023-03-03 NOTE — Telephone Encounter (Signed)
 Copied from CRM [id number]. Topic: Clinical - Pink Word Triage >> Mar 03, 2023 11:00 AM Payton Doughty wrote: Paul [name] Word that prompted transfer to Nurse Triage: pt called for a refill of meloxicam (MOBIC) 15 MG tablet. Pt was getting from Rehab.  He called their office, but the dr said he needs to get from pcp.  No one ever called him back from their office to give him that info.  Pt states he is beginning to "feel" the side effects from not having that med.  He states he is not feeling good at all.  Pt asked for appt today, but [facility] does not have any appts. Pt is willing to have a virtual appt.  Unable to schedule due to the system not allowing me.  Called for help.  Team lead confirmed the system is messed up, and need to call the office. Called [facility] and they do not have any appts and declined to schedule pt.  Chief Complaint: back pain  Symptoms: low back pain: more on left side: pain ranges from 3 to 9/10.  9/10 last night therefore had to get up and move around to help provider relief. Frequency: ongoing but worsening due to ran out of mobic Rx for back pain Pertinent Negatives: Patient denies pain radiates to other areas Disposition: [] ED /[] Urgent Care (no appt availability in office) / Appointment(In office/virtua[x] l)/ []  [facility]/ [] Home Care/ [] Refused Recommended Disposition /[] [facility]/ []  Follow-up with PCP Additional Notes: pt stated has been on mobic for pain: ran out of medication x 1 week & now struggling to bend, stand for long periods of time, to sleep at night due to pain.  Pt stated constantly have to move around & not be in one position to long due to pain and discomfort.  pt stated last night almost wanted to scream pain rose to 9/10 pain therefore restless night. Pt offered in person appointment for Monday: pt requesting video appointment in order to attempted to get medication refilled today.  Reason for Disposition  [1] SEVERE back pain (e.g.,  excruciating, unable to do any normal activities) AND [2] not improved 2 hours after pain medicine    Low back pain constant: but w/long periods of standing/sitting or bending, sleeping pain can increase from 3 to 9/10: pain normally controlled with mobic: out of medication x 1 week  Answer Assessment - Initial Assessment Questions 1. ONSET: "When did the pain begin?"      1 week  2. LOCATION: "Where does it hurt?" (upper, mid or lower back)     Lower back pain ongoing but you have been maintaing with mobic: last week was when ran out of medication therefore harder bend & unable to rest at night due to chronic back pain up to 9/10 pain  3. SEVERITY: "How bad is the pain?"  (e.g., Scale 1-10; mild, moderate, or severe)   - MILD (1-3): Doesn't interfere with normal activities.    - MODERATE (4-7): Interferes with normal activities or awakens from sleep.    - SEVERE (8-10): Excruciating pain, unable to do any normal activities.      Mild to severe 4. PATTERN: "Is the pain constant?" (e.g., yes, no; constant, intermittent)      Constant pain 3/10: then with movement/standing/bending/sleeping gets worse up to 9/10 5. RADIATION: "Does the pain shoot into your legs or somewhere else?"     no 6. CAUSE:  "What do you think is causing the back pain?"  Contruction work therfore wear and tear  7. BACK OVERUSE:  "Any recent lifting of heavy objects, strenuous work or exercise?"     No long term overuse 8. MEDICINES: "What have you taken so far for the pain?" (e.g., nothing, acetaminophen, NSAIDS)     Mobic - ran out  9. NEUROLOGIC SYMPTOMS: "Do you have any weakness, numbness, or problems with bowel/bladder control?"     no 10. OTHER SYMPTOMS: "Do you have any other symptoms?" (e.g., fever, abdomen pain, burning with urination, blood in urine)       no 11. PREGNANCY: "Is there any chance you are pregnant?" "When was your last menstrual period?"       N/a  Protocols used: Back Pain-A-AH

## 2023-03-06 ENCOUNTER — Ambulatory Visit: Payer: Medicaid Other | Admitting: Nurse Practitioner

## 2023-03-06 ENCOUNTER — Encounter: Payer: Self-pay | Admitting: Nurse Practitioner

## 2023-03-06 VITALS — BP 135/73 | Ht 70.0 in | Wt 280.0 lb

## 2023-03-06 DIAGNOSIS — M47816 Spondylosis without myelopathy or radiculopathy, lumbar region: Secondary | ICD-10-CM | POA: Diagnosis not present

## 2023-03-06 MED ORDER — MELOXICAM 15 MG PO TABS: 15.0000 mg | ORAL_TABLET | Freq: Every day | ORAL | 2 refills | Status: DC

## 2023-03-06 MED ORDER — TADALAFIL 10 MG PO TABS: 10.0000 mg | ORAL_TABLET | Freq: Every day | ORAL | 3 refills | Status: AC

## 2023-03-06 NOTE — Assessment & Plan Note (Signed)
 Chronic pain.  Has been seeing pain management in [place] who was writing Meloxicam for daily use.  Discussed with patient side effects of long term use of NSAIDS.  Refilled prescription today to get patient to next appt with PCP.  Encouraged patient to discuss medication and other options at next visit.  Referral placed for another pain management to see if intervention is an option for back pain.

## 2023-03-06 NOTE — Progress Notes (Signed)
 BP 135/73 (BP Location: Right Arm, Patient Position: Sitting, Cuff Size: Large)   Ht 5\' 10"  (1.778 m)   Wt 280 lb (127 kg)   BMI 40.18 kg/m    Subjective:    Patient ID: Paul [name], male    DOB: [birth year], 53 y.o.   MRN: [id number]  HPI: Paul [name] is a 53 y.o. male  Chief Complaint  Patient presents with   Back Pain    Med refill for lower back pain.   BACK PAIN Patient states he has long standing back pain.  He has been seeing a pain specialist in [place] and he has been on Meloxicam.  He has been a week and a half without the medication and he is waking up multiple times per night due to the back pain.  Patient has not done back injections or any procedures with his pain management provider.   Relevant past medical, surgical, family and social history reviewed and updated as indicated. Interim medical history since our last visit reviewed. Allergies and medications reviewed and updated.  Review of Systems  Musculoskeletal:  Positive for back pain.    Per HPI unless specifically indicated above     Objective:    BP 135/73 (BP Location: Right Arm, Patient Position: Sitting, Cuff Size: Large)   Ht 5\' 10"  (1.778 m)   Wt 280 lb (127 kg)   BMI 40.18 kg/m   Wt Readings from Last 3 Encounters:  03/06/23 280 lb (127 kg)  11/08/22 279 lb (126.6 kg)  05/03/22 281 lb 9.6 oz (127.7 kg)    Physical Exam Vitals and nursing note reviewed.  Constitutional:      General: He is not in acute distress.    Appearance: Normal appearance. He is not ill-appearing, toxic-appearing or diaphoretic.  HENT:     Head: Normocephalic.     Right Ear: External ear normal.     Left Ear: External ear normal.     Nose: Nose normal. No congestion or rhinorrhea.     Mouth/Throat:     Mouth: Mucous membranes are moist.  Eyes:     General:        Right eye: No discharge.        Left eye: No discharge.     Extraocular Movements: Extraocular movements intact.     Conjunctiva/sclera:  Conjunctivae normal.     Pupils: Pupils are equal, round, and reactive to light.  Cardiovascular:     Rate and Rhythm: Normal rate and regular rhythm.     Heart sounds: No murmur heard. Pulmonary:     Effort: Pulmonary effort is normal. No respiratory distress.     Breath sounds: Normal breath sounds. No wheezing, rhonchi or rales.  Abdominal:     General: Abdomen is flat. Bowel sounds are normal.  Musculoskeletal:     Cervical back: Normal range of motion and neck supple.  Skin:    General: Skin is warm and dry.     Capillary Refill: Capillary refill takes less than 2 seconds.  Neurological:     General: No focal deficit present.     Mental Status: He is alert and oriented to person, place, and time.  Psychiatric:        Mood and Affect: Mood normal.        Behavior: Behavior normal.        Thought Content: Thought content normal.        Judgment: Judgment normal.     Results for orders placed  or performed in visit on 11/08/22  Comprehensive metabolic panel   Collection Time: 11/08/22  4:25 PM  Result Value Ref Range   Glucose 98 70 - 99 mg/dL   BUN 16 6 - 24 mg/dL   Creatinine, Ser 1.61 0.76 - 1.27 mg/dL   eGFR 98 >09 UE/AVW/0.98   BUN/Creatinine Ratio 17 9 - 20   Sodium 140 134 - 144 mmol/L   Potassium 4.1 3.5 - 5.2 mmol/L   Chloride 101 96 - 106 mmol/L   CO2 24 20 - 29 mmol/L   Calcium 9.3 8.7 - 10.2 mg/dL   Total Protein 6.9 6.0 - 8.5 g/dL   Albumin 4.5 3.8 - 4.9 g/dL   Globulin, Total 2.4 1.5 - 4.5 g/dL   Bilirubin Total 0.5 0.0 - 1.2 mg/dL   Alkaline Phosphatase 84 44 - 121 IU/L   AST 23 0 - 40 IU/L   ALT 29 0 - 44 IU/L  Lipid Panel w/o Chol/HDL Ratio   Collection Time: 11/08/22  4:25 PM  Result Value Ref Range   Cholesterol, Total 174 100 - 199 mg/dL   Triglycerides 119 (H) 0 - 149 mg/dL   HDL 41 >14 mg/dL   VLDL Cholesterol Cal 35 5 - 40 mg/dL   LDL Chol Calc (NIH) 98 0 - 99 mg/dL      Assessment & Plan:   Problem List Items Addressed This Visit    None    Follow up plan: No follow-ups on file.

## 2023-03-19 NOTE — Patient Instructions (Signed)

## 2023-03-21 ENCOUNTER — Encounter: Payer: Self-pay | Admitting: Nurse Practitioner

## 2023-03-21 ENCOUNTER — Ambulatory Visit (INDEPENDENT_AMBULATORY_CARE_PROVIDER_SITE_OTHER): Payer: Self-pay | Admitting: Nurse Practitioner

## 2023-03-21 VITALS — BP 135/72 | HR 65 | Ht 70.0 in | Wt 283.3 lb

## 2023-03-21 DIAGNOSIS — Z0289 Encounter for other administrative examinations: Secondary | ICD-10-CM | POA: Insufficient documentation

## 2023-03-21 NOTE — Progress Notes (Signed)
 BP 135/72 (BP Location: Left Arm, Patient Position: Sitting, Cuff Size: Normal)   Pulse 65   Ht 5\' 10"  (1.778 m)   Wt 283 lb 4.8 oz (128.5 kg)   BMI 40.65 kg/m    Subjective:    Patient ID: Paul [name], male    DOB: [birth year], 53 y.o.   MRN: [id number]  HPI: Paul [name] is a 53 y.o. 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  Previous DOT physical was over 6 years ago.  Currently patient drives in and out of state.  Has well controlled HTN, takes medication daily.  Past Medical History:  Past Medical History:  Diagnosis Date   Hypertension    Primary hypertension 12/11/2020   Vitamin D deficiency     Surgical History:  Past Surgical History:  Procedure Laterality Date   APPENDECTOMY     HERNIA REPAIR      Medications:  Current Outpatient Medications on File Prior to Visit  Medication Sig   Cholecalciferol 1.25 MG (50000 UT) TABS Take 2 tablets by mouth daily.   lisinopril-hydrochlorothiazide (ZESTORETIC) 20-25 MG tablet TAKE 1 TABLET BY MOUTH DAILY   meloxicam (MOBIC) 15 MG tablet Take 1 tablet (15 mg total) by mouth daily.   MULTIPLE VITAMIN PO Take by mouth. Takes 2 gummy vitamins a day   tadalafil (CIALIS) 10 MG tablet Take 1 tablet (10 mg total) by mouth daily.   No current facility-administered medications on file prior to visit.    Allergies:  No Known Allergies  Social History:  Social History   Socioeconomic History   Marital status: Married    Spouse name: Not on file   Number of children: Not on file   Years of education: Not on file   Highest education level: Not on file  Occupational History   Occupation: unemployed  Tobacco Use   Smoking status: Never    Passive exposure: Never   Smokeless tobacco: Never  Vaping Use   Vaping status: Never Used  Substance and Sexual Activity   Alcohol use: Yes    Alcohol/week: 18.0 standard drinks of alcohol    Types: 18 Cans of beer per week   Drug use: Never   Sexual activity: Not Currently   Other Topics Concern   Not on file  Social History Narrative   Married for 8 months,second.Lives with wife and daughter.Moved from [place] in 2018.   Social Drivers of Corporate investment banker Strain: Not on file  Food Insecurity: Not on file  Transportation Needs: Not on file  Physical Activity: Not on file  Stress: Not on file  Social Connections: Not on file  Intimate Partner Violence: Not on file   Social History   Tobacco Use  Smoking Status Never   Passive exposure: Never  Smokeless Tobacco Never   Social History   Substance and Sexual Activity  Alcohol Use Yes   Alcohol/week: 18.0 standard drinks of alcohol   Types: 18 Cans of beer per week    Family History:  Family History  Problem Relation Age of Onset   Healthy Mother    Cancer Father    Lung cancer Father     Past medical history, surgical history, medications, allergies, family history and social history reviewed with patient today and changes made to appropriate areas of the chart.   ROS All other ROS negative except what is listed above and in the HPI.      Objective:    BP 135/72 (BP Location: Left  Arm, Patient Position: Sitting, Cuff Size: Normal)   Pulse 65   Ht 5\' 10"  (1.778 m)   Wt 283 lb 4.8 oz (128.5 kg)   BMI 40.65 kg/m   Wt Readings from Last 3 Encounters:  03/21/23 283 lb 4.8 oz (128.5 kg)  03/06/23 280 lb (127 kg)  11/08/22 279 lb (126.6 kg)    Physical Exam Vitals and nursing note reviewed.  Constitutional:      General: He is awake. He is not in acute distress.    Appearance: He is well-developed and well-groomed. He is obese. He is not ill-appearing or toxic-appearing.  HENT:     Head: Normocephalic and atraumatic.     Right Ear: Hearing, tympanic membrane, ear canal and external ear normal. No drainage.     Left Ear: Hearing, tympanic membrane, ear canal and external ear normal. No drainage.     Nose: Nose normal.     Mouth/Throat:     Pharynx: Uvula midline.   Eyes:     General: Lids are normal.        Right eye: No discharge.        Left eye: No discharge.     Extraocular Movements: Extraocular movements intact.     Conjunctiva/sclera: Conjunctivae normal.     Pupils: Pupils are equal, round, and reactive to light.     Visual Fields: Right eye visual fields normal and left eye visual fields normal.     Comments: Field of vision 70 degrees both eyes.  Neck:     Thyroid: No thyromegaly.     Vascular: No carotid bruit or JVD.     Trachea: Trachea normal.  Cardiovascular:     Rate and Rhythm: Normal rate and regular rhythm.     Heart sounds: Normal heart sounds, S1 normal and S2 normal. No murmur heard.    No gallop.  Pulmonary:     Effort: Pulmonary effort is normal. No accessory muscle usage or respiratory distress.     Breath sounds: Normal breath sounds.  Abdominal:     General: Bowel sounds are normal.     Palpations: Abdomen is soft. There is no hepatomegaly or splenomegaly.     Tenderness: There is no abdominal tenderness.  Musculoskeletal:        General: Normal range of motion.     Cervical back: Normal range of motion and neck supple.     Right lower leg: No edema.     Left lower leg: No edema.  Lymphadenopathy:     Head:     Right side of head: No submental, submandibular, tonsillar, preauricular or posterior auricular adenopathy.     Left side of head: No submental, submandibular, tonsillar, preauricular or posterior auricular adenopathy.     Cervical: No cervical adenopathy.  Skin:    General: Skin is warm and dry.     Capillary Refill: Capillary refill takes less than 2 seconds.     Findings: No rash.  Neurological:     Mental Status: He is alert and oriented to person, place, and time.     Gait: Gait is intact.     Deep Tendon Reflexes: Reflexes are normal and symmetric.     Reflex Scores:      Brachioradialis reflexes are 2+ on the right side and 2+ on the left side.      Patellar reflexes are 2+ on the right side  and 2+ on the left side. Psychiatric:        Attention  and Perception: Attention normal.        Mood and Affect: Mood normal.        Speech: Speech normal.        Behavior: Behavior normal. Behavior is cooperative.        Thought Content: Thought content normal.        Cognition and Memory: Cognition normal.    Hearing Screening  Method: Audiometry   500Hz  1000Hz  2000Hz  4000Hz   Right ear 20 20 20 25   Left ear 20 20 20 25    Vision Screening   Right eye Left eye Both eyes  Without correction 20/25 20/25 20/20   With correction       Urine dipstick shows negative for all components.  Micro exam: not done.   Results for orders placed or performed in visit on 11/08/22  Comprehensive metabolic panel   Collection Time: 11/08/22  4:25 PM  Result Value Ref Range   Glucose 98 70 - 99 mg/dL   BUN 16 6 - 24 mg/dL   Creatinine, Ser 7.84 0.76 - 1.27 mg/dL   eGFR 98 >69 GE/XBM/8.41   BUN/Creatinine Ratio 17 9 - 20   Sodium 140 134 - 144 mmol/L   Potassium 4.1 3.5 - 5.2 mmol/L   Chloride 101 96 - 106 mmol/L   CO2 24 20 - 29 mmol/L   Calcium 9.3 8.7 - 10.2 mg/dL   Total Protein 6.9 6.0 - 8.5 g/dL   Albumin 4.5 3.8 - 4.9 g/dL   Globulin, Total 2.4 1.5 - 4.5 g/dL   Bilirubin Total 0.5 0.0 - 1.2 mg/dL   Alkaline Phosphatase 84 44 - 121 IU/L   AST 23 0 - 40 IU/L   ALT 29 0 - 44 IU/L  Lipid Panel w/o Chol/HDL Ratio   Collection Time: 11/08/22  4:25 PM  Result Value Ref Range   Cholesterol, Total 174 100 - 199 mg/dL   Triglycerides 324 (H) 0 - 149 mg/dL   HDL 41 >40 mg/dL   VLDL Cholesterol Cal 35 5 - 40 mg/dL   LDL Chol Calc (NIH) 98 0 - 99 mg/dL      Assessment & Plan:   Problem List Items Addressed This Visit       Other   Encounter for examination required by Department of Transportation (DOT) - Primary   DOT Certificate provided x 1 year -- has HTN   Hearing test: Pass at 20' Vision: 20/25 R, 20/25 L, 20/20 Both Urine 1.000, Trace Protein, Neg Glucose, Neg Hematuria            Follow up plan: Return for as scheduled April 29th.   PATIENT COUNSELING:    Advised to avoid cigarette smoking.  I discussed with the patient that most people either abstain from alcohol or drink within safe limits (<=14/week and <=4 drinks/occasion for males, <=7/weeks and <= 3 drinks/occasion for females) and that the risk for alcohol disorders and other health effects rises proportionally with the number of drinks per week and how often a drinker exceeds daily limits.  Discussed cessation/primary prevention of drug use and availability of treatment for abuse.   Diet: Encouraged to adjust caloric intake to maintain  or achieve ideal body weight, to reduce intake of dietary saturated fat and total fat, to limit sodium intake by avoiding high sodium foods and not adding table salt, and to maintain adequate dietary potassium and calcium preferably from fresh fruits, vegetables, and low-fat dairy products.    Stressed the importance of regular  exercise  Injury prevention: Discussed safety belts, safety helmets, smoke detector, smoking near bedding or upholstery.   Dental health: Discussed importance of regular tooth brushing, flossing, and dental visits.    NEXT PREVENTATIVE PHYSICAL DUE IN 1 YEAR. Return for as scheduled April 29th.

## 2023-03-21 NOTE — Assessment & Plan Note (Signed)
 DOT Certificate provided x 1 year -- has HTN   Hearing test: Pass at 20' Vision: 20/25 R, 20/25 L, 20/20 Both Urine 1.000, Trace Protein, Neg Glucose, Neg Hematuria

## 2023-04-02 NOTE — Progress Notes (Unsigned)
 Patient: Paul [name]  Service Category: E/M  Provider: Oswaldo Done, MD  DOB: 1970/12/01  DOS: 04/03/2023  Referring Provider: Larae Grooms, NP  MRN: [id number]  Setting: Ambulatory outpatient  PCP: Marjie Skiff, NP  Type: New Patient  Specialty: Interventional Pain Management    Location: Office  Delivery: Face-to-face     Primary Reason(s) for Visit: Encounter for initial evaluation of one or more chronic problems (new to examiner) potentially causing chronic pain, and posing a threat to normal musculoskeletal function. (Level of risk: High) CC: No chief complaint on file.  HPI  Paul [name] is a 53 y.o. year old, male patient, who comes for the first time to our practice referred by Larae Grooms, NP for our initial evaluation of his chronic pain. He has Essential hypertension, benign; Hyperlipidemia; Vitamin D deficiency; Erectile dysfunction; Myofascial pain syndrome of lumbar spine; Spondylosis without myelopathy or radiculopathy, lumbar region; Lumbar degenerative disc disease; and Encounter for examination required by Department of Transportation (DOT) on their problem list. Today he comes in for evaluation of his No chief complaint on file.  Pain Assessment: Location:     Radiating:   Onset:   Duration:   Quality:   Severity:  /10 (subjective, self-reported pain score)  Effect on ADL:   Timing:   Modifying factors:   BP:    HR:    Onset and Duration: {Hx; Onset and Duration:210120511} Cause of pain: {Hx; Cause:210120521} Severity: {Pain Severity:210120502} Timing: {Symptoms; Timing:210120501} Aggravating Factors: {Causes; Aggravating pain factors:210120507} Alleviating Factors: {Causes; Alleviating Factors:210120500} Associated Problems: {Hx; Associated problems:210120515} Quality of Pain: {Hx; Symptom quality or Descriptor:210120531} Previous Examinations or Tests: {Hx; Previous examinations or test:210120529} Previous Treatments: {Hx; Previous  Treatment:210120503}  Paul [name] is being evaluated for possible interventional pain management therapies for the treatment of his chronic pain.  Discussed the use of AI scribe software for clinical note transcription with the patient, who gave verbal consent to proceed.  History of Present Illness           *** Paul [name] has been informed that this initial visit was an evaluation only.  On the follow up appointment I will go over the results, including ordered tests and available interventional therapies. At that time he will have the opportunity to decide whether to proceed with offered therapies or not. In the event that Paul [name] prefers avoiding interventional options, this will conclude our involvement in the case.  Medication management recommendations may be provided upon request.  Patient informed that diagnostic tests may be ordered to assist in identifying underlying causes, narrow the list of differential diagnoses and aid in determining candidacy for (or contraindications to) planned therapeutic interventions.  Historic Controlled Substance Pharmacotherapy Review  PMP and historical list of controlled substances: ***  Most recently prescribed opioid analgesics:   *** MME/day: *** mg/day  Historical Monitoring: The patient  reports no history of drug use. List of prior UDS Testing: No results found for: "MDMA", "COCAINSCRNUR", "PCPSCRNUR", "PCPQUANT", "CANNABQUANT", "THCU", "ETH", "CBDTHCR", "D8THCCBX", "D9THCCBX" Historical Background Evaluation: [place] PMP: PDMP reviewed during this encounter. Review of the past 32-months conducted.             PMP NARX Score Report:  Narcotic: 000 Sedative: 000 Stimulant: 000 [place] Department of public safety, offender search: Engineer, mining Information) Non-contributory Risk Assessment Profile: Aberrant behavior: None observed or detected today Risk factors for fatal opioid overdose: None identified today PMP NARX Overdose Risk Score:  000 Fatal overdose hazard ratio (HR): Calculation deferred Non-fatal  overdose hazard ratio (HR): Calculation deferred Risk of opioid abuse or dependence: 0.7-3.0% with doses <= 36 MME/day and 6.1-26% with doses >= 120 MME/day. Substance use disorder (SUD) risk level: See below Personal History of Substance Abuse (SUD-Substance use disorder):  Alcohol:    Illegal Drugs:    Rx Drugs:    ORT Risk Level calculation:    ORT Scoring interpretation table:  Score <3 = Low Risk for SUD  Score between 4-7 = Moderate Risk for SUD  Score >8 = High Risk for Opioid Abuse   PHQ-2 Depression Scale:  Total score:    PHQ-2 Scoring interpretation table: (Score and probability of major depressive disorder)  Score 0 = No depression  Score 1 = 15.4% Probability  Score 2 = 21.1% Probability  Score 3 = 38.4% Probability  Score 4 = 45.5% Probability  Score 5 = 56.4% Probability  Score 6 = 78.6% Probability   PHQ-9 Depression Scale:  Total score:    PHQ-9 Scoring interpretation table:  Score 0-4 = No depression  Score 5-9 = Mild depression  Score 10-14 = Moderate depression  Score 15-19 = Moderately severe depression  Score 20-27 = Severe depression (2.4 times higher risk of SUD and 2.89 times higher risk of overuse)   Pharmacologic Plan: As per protocol, I have not taken over any controlled substance management, pending the results of ordered tests and/or consults.            Initial impression: Pending review of available data and ordered tests.  Meds   Current Outpatient Medications:    Cholecalciferol 1.25 MG (50000 UT) TABS, Take 2 tablets by mouth daily., Disp: , Rfl:    lisinopril-hydrochlorothiazide (ZESTORETIC) 20-25 MG tablet, TAKE 1 TABLET BY MOUTH DAILY, Disp: 90 tablet, Rfl: 0   meloxicam (MOBIC) 15 MG tablet, Take 1 tablet (15 mg total) by mouth daily., Disp: 30 tablet, Rfl: 2   MULTIPLE VITAMIN PO, Take by mouth. Takes 2 gummy vitamins a day, Disp: , Rfl:    tadalafil (CIALIS) 10  MG tablet, Take 1 tablet (10 mg total) by mouth daily., Disp: 90 tablet, Rfl: 3  Imaging Review  Cervical Imaging: Cervical MR wo contrast: No results found for this or any previous visit.  Cervical MR wo contrast: No results found for this or any previous visit.  Cervical MR w/wo contrast: No results found for this or any previous visit.  Cervical MR w contrast: No results found for this or any previous visit.  Cervical CT wo contrast: No results found for this or any previous visit.  Cervical CT w/wo contrast: No results found for this or any previous visit.  Cervical CT w/wo contrast: No results found for this or any previous visit.  Cervical CT w contrast: No results found for this or any previous visit.  Cervical CT outside: No results found for this or any previous visit.  Cervical DG 1 view: No results found for this or any previous visit.  Cervical DG 2-3 views: No results found for this or any previous visit.  Cervical DG F/E views: No results found for this or any previous visit.  Cervical DG 2-3 clearing views: No results found for this or any previous visit.  Cervical DG Bending/F/E views: No results found for this or any previous visit.  Cervical DG complete: No results found for this or any previous visit.  Cervical DG Myelogram views: No results found for this or any previous visit.  Cervical DG Myelogram views: No  results found for this or any previous visit.  Cervical Discogram views: No results found for this or any previous visit.   Shoulder Imaging: Shoulder-R MR w contrast: No results found for this or any previous visit.  Shoulder-L MR w contrast: No results found for this or any previous visit.  Shoulder-R MR w/wo contrast: No results found for this or any previous visit.  Shoulder-L MR w/wo contrast: No results found for this or any previous visit.  Shoulder-R MR wo contrast: No results found for this or any previous visit.  Shoulder-L MR wo  contrast: No results found for this or any previous visit.  Shoulder-R CT w contrast: No results found for this or any previous visit.  Shoulder-L CT w contrast: No results found for this or any previous visit.  Shoulder-R CT w/wo contrast: No results found for this or any previous visit.  Shoulder-L CT w/wo contrast: No results found for this or any previous visit.  Shoulder-R CT wo contrast: No results found for this or any previous visit.  Shoulder-L CT wo contrast: No results found for this or any previous visit.  Shoulder-R DG Arthrogram: No results found for this or any previous visit.  Shoulder-L DG Arthrogram: No results found for this or any previous visit.  Shoulder-R DG 1 view: No results found for this or any previous visit.  Shoulder-L DG 1 view: No results found for this or any previous visit.  Shoulder-R DG: No results found for this or any previous visit.  Shoulder-L DG: No results found for this or any previous visit.   Thoracic Imaging: Thoracic MR wo contrast: No results found for this or any previous visit.  Thoracic MR wo contrast: No results found for this or any previous visit.  Thoracic MR w/wo contrast: No results found for this or any previous visit.  Thoracic MR w contrast: No results found for this or any previous visit.  Thoracic CT wo contrast: No results found for this or any previous visit.  Thoracic CT w/wo contrast: No results found for this or any previous visit.  Thoracic CT w/wo contrast: No results found for this or any previous visit.  Thoracic CT w contrast: No results found for this or any previous visit.  Thoracic DG 2-3 views: No results found for this or any previous visit.  Thoracic DG 4 views: No results found for this or any previous visit.  Thoracic DG: No results found for this or any previous visit.  Thoracic DG w/swimmers view: No results found for this or any previous visit.  Thoracic DG Myelogram views: No  results found for this or any previous visit.  Thoracic DG Myelogram views: No results found for this or any previous visit.   Lumbosacral Imaging: Lumbar MR wo contrast: Results for orders placed in visit on 12/17/19  MR Lumbar Spine Wo Contrast  Narrative CLINICAL DATA:  Low back pain and numbness for 2 years.  EXAM: MRI LUMBAR SPINE WITHOUT CONTRAST  TECHNIQUE: Multiplanar, multisequence MR imaging of the lumbar spine was performed. No intravenous contrast was administered.  COMPARISON:  None.  FINDINGS: Segmentation:  Standard.  Alignment:  Maintained.  Vertebrae: No fracture, evidence of discitis, or bone lesion. Scattered, mild degenerative endplate signal change noted.  Conus medullaris and cauda equina: Conus extends to the L1 level. Conus and cauda equina appear normal.  Paraspinal and other soft tissues: Negative.  Disc levels:  T12-L1 is imaged in the sagittal plane only and negative.  L1-2: Negative.  L2-3: Negative.  L3-4: Shallow disc bulge without stenosis.  L4-5: Shallow disc bulge and mild facet degenerative change without stenosis.  L5-S1: Shallow disc bulge without stenosis.  IMPRESSION: Mild lumbar degenerative change without central canal or foraminal stenosis.   Electronically Signed By: Drusilla Kanner M.D. On: 12/28/2019 15:58  Lumbar MR wo contrast: No results found for this or any previous visit.  Lumbar MR w/wo contrast: No results found for this or any previous visit.  Lumbar MR w/wo contrast: No results found for this or any previous visit.  Lumbar MR w contrast: No results found for this or any previous visit.  Lumbar CT wo contrast: No results found for this or any previous visit.  Lumbar CT w/wo contrast: No results found for this or any previous visit.  Lumbar CT w/wo contrast: No results found for this or any previous visit.  Lumbar CT w contrast: No results found for this or any previous visit.  Lumbar DG  1V: No results found for this or any previous visit.  Lumbar DG 1V (Clearing): No results found for this or any previous visit.  Lumbar DG 2-3V (Clearing): No results found for this or any previous visit.  Lumbar DG 2-3 views: No results found for this or any previous visit.  Lumbar DG (Complete) 4+V: No results found for this or any previous visit.        Lumbar DG F/E views: No results found for this or any previous visit.        Lumbar DG Bending views: No results found for this or any previous visit.        Lumbar DG Myelogram views: No results found for this or any previous visit.  Lumbar DG Myelogram: No results found for this or any previous visit.  Lumbar DG Myelogram: No results found for this or any previous visit.  Lumbar DG Myelogram: No results found for this or any previous visit.  Lumbar DG Myelogram Lumbosacral: No results found for this or any previous visit.  Lumbar DG Diskogram views: No results found for this or any previous visit.  Lumbar DG Diskogram views: No results found for this or any previous visit.  Lumbar DG Epidurogram OP: No results found for this or any previous visit.  Lumbar DG Epidurogram IP: No results found for this or any previous visit.   Sacroiliac Joint Imaging: Sacroiliac Joint DG: No results found for this or any previous visit.  Sacroiliac Joint MR w/wo contrast: No results found for this or any previous visit.  Sacroiliac Joint MR wo contrast: No results found for this or any previous visit.   Spine Imaging: Whole Spine DG Myelogram views: No results found for this or any previous visit.  Whole Spine MR Mets screen: No results found for this or any previous visit.  Whole Spine MR Mets screen: No results found for this or any previous visit.  Whole Spine MR w/wo: No results found for this or any previous visit.  MRA Spinal Canal w/ cm: No results found for this or any previous visit.  MRA Spinal Canal wo/ cm: No results  found for this or any previous visit.  MRA Spinal Canal w/wo cm: No results found for this or any previous visit.  Spine Outside MR Films: No results found for this or any previous visit.  Spine Outside CT Films: No results found for this or any previous visit.  CT-Guided Biopsy: No results found for this or any previous visit.  CT-Guided  Needle Placement: No results found for this or any previous visit.  DG Spine outside: No results found for this or any previous visit.  IR Spine outside: No results found for this or any previous visit.  NM Spine outside: No results found for this or any previous visit.   Hip Imaging: Hip-R MR w contrast: No results found for this or any previous visit.  Hip-L MR w contrast: No results found for this or any previous visit.  Hip-R MR w/wo contrast: No results found for this or any previous visit.  Hip-L MR w/wo contrast: No results found for this or any previous visit.  Hip-R MR wo contrast: No results found for this or any previous visit.  Hip-L MR wo contrast: No results found for this or any previous visit.  Hip-R CT w contrast: No results found for this or any previous visit.  Hip-L CT w contrast: No results found for this or any previous visit.  Hip-R CT w/wo contrast: No results found for this or any previous visit.  Hip-L CT w/wo contrast: No results found for this or any previous visit.  Hip-R CT wo contrast: No results found for this or any previous visit.  Hip-L CT wo contrast: No results found for this or any previous visit.  Hip-R DG 2-3 views: No results found for this or any previous visit.  Hip-L DG 2-3 views: No results found for this or any previous visit.  Hip-R DG Arthrogram: No results found for this or any previous visit.  Hip-L DG Arthrogram: No results found for this or any previous visit.  Hip-B DG Bilateral: No results found for this or any previous visit.  Hip-B DG Bilateral (5V): No results found for  this or any previous visit.   Knee Imaging: Knee-R MR w contrast: No results found for this or any previous visit.  Knee-L MR w/o contrast: No results found for this or any previous visit.  Knee-R MR w/wo contrast: No results found for this or any previous visit.  Knee-L MR w/wo contrast: No results found for this or any previous visit.  Knee-R MR wo contrast: No results found for this or any previous visit.  Knee-L MR wo contrast: No results found for this or any previous visit.  Knee-R CT w contrast: No results found for this or any previous visit.  Knee-L CT w contrast: No results found for this or any previous visit.  Knee-R CT w/wo contrast: No results found for this or any previous visit.  Knee-L CT w/wo contrast: No results found for this or any previous visit.  Knee-R CT wo contrast: No results found for this or any previous visit.  Knee-L CT wo contrast: No results found for this or any previous visit.  Knee-R DG 1-2 views: No results found for this or any previous visit.  Knee-L DG 1-2 views: No results found for this or any previous visit.  Knee-R DG 3 views: No results found for this or any previous visit.  Knee-L DG 3 views: No results found for this or any previous visit.  Knee-R DG 4 views: No results found for this or any previous visit.  Knee-L DG 4 views: No results found for this or any previous visit.  Knee-R DG Arthrogram: No results found for this or any previous visit.  Knee-L DG Arthrogram: No results found for this or any previous visit.   Ankle Imaging: Ankle-R DG Complete: No results found for this or any previous visit.  Ankle-L DG Complete: No results found for this or any previous visit.   Foot Imaging: Foot-R DG Complete: No results found for this or any previous visit.  Foot-L DG Complete: No results found for this or any previous visit.   Elbow Imaging: Elbow-R DG Complete: No results found for this or any previous  visit.  Elbow-L DG Complete: No results found for this or any previous visit.   Wrist Imaging: Wrist-R DG Complete: No results found for this or any previous visit.  Wrist-L DG Complete: No results found for this or any previous visit.   Hand Imaging: Hand-R DG Complete: No results found for this or any previous visit.  Hand-L DG Complete: No results found for this or any previous visit.   Complexity Note: Imaging results reviewed.                         ROS  Cardiovascular: {Hx; Cardiovascular History:210120525} Pulmonary or Respiratory: {Hx; Pumonary and/or Respiratory History:210120523} Neurological: {Hx; Neurological:210120504} Psychological-Psychiatric: {Hx; Psychological-Psychiatric History:210120512} Gastrointestinal: {Hx; Gastrointestinal:210120527} Genitourinary: {Hx; Genitourinary:210120506} Hematological: {Hx; Hematological:210120510} Endocrine: {Hx; Endocrine history:210120509} Rheumatologic: {Hx; Rheumatological:210120530} Musculoskeletal: {Hx; Musculoskeletal:210120528} Work History: {Hx; Work history:210120514}  Allergies  Paul [name] has no known allergies.  Laboratory Chemistry Profile   Renal Lab Results  Component Value Date   BUN 16 11/08/2022   CREATININE 0.93 11/08/2022   BCR 17 11/08/2022   GFRAA 90 02/19/2020   GFRNONAA 77 02/19/2020   SPECGRAV 1.020 11/26/2020   PHUR 6.0 11/26/2020   PROTEINUR Negative 11/26/2020     Electrolytes Lab Results  Component Value Date   NA 140 11/08/2022   K 4.1 11/08/2022   CL 101 11/08/2022   CALCIUM 9.3 11/08/2022     Hepatic Lab Results  Component Value Date   AST 23 11/08/2022   ALT 29 11/08/2022   ALBUMIN 4.5 11/08/2022   ALKPHOS 84 11/08/2022     ID Lab Results  Component Value Date   HIV Non Reactive 05/03/2022     Bone Lab Results  Component Value Date   VD25OH 27.2 (L) 05/03/2022   TESTOFREE 201.2 12/17/2019   TESTOSTERONE 873 (H) 12/17/2019     Endocrine Lab Results   Component Value Date   GLUCOSE 98 11/08/2022   GLUCOSEU Negative 11/26/2020   HGBA1C 4.5 (L) 04/01/2021   TSH 1.840 10/05/2021   TESTOFREE 201.2 12/17/2019   TESTOSTERONE 873 (H) 12/17/2019   SHBG 19 12/17/2019     Neuropathy Lab Results  Component Value Date   VITAMINB12 341 04/01/2021   HGBA1C 4.5 (L) 04/01/2021   HIV Non Reactive 05/03/2022     CNS No results found for: "COLORCSF", "APPEARCSF", "RBCCOUNTCSF", "WBCCSF", "POLYSCSF", "LYMPHSCSF", "EOSCSF", "PROTEINCSF", "GLUCCSF", "JCVIRUS", "CSFOLI", "IGGCSF", "LABACHR", "ACETBL"   Inflammation (CRP: Acute  ESR: Chronic) No results found for: "CRP", "ESRSEDRATE", "LATICACIDVEN"   Rheumatology No results found for: "RF", "ANA", "LABURIC", "URICUR", "LYMEIGGIGMAB", "LYMEABIGMQN", "HLAB27"   Coagulation Lab Results  Component Value Date   PLT 152 10/05/2021     Cardiovascular Lab Results  Component Value Date   HGB 15.2 10/05/2021   HCT 44.4 10/05/2021     Screening Lab Results  Component Value Date   HIV Non Reactive 05/03/2022     Cancer No results found for: "CEA", "CA125", "LABCA2"   Allergens No results found for: "ALMOND", "APPLE", "ASPARAGUS", "AVOCADO", "BANANA", "BARLEY", "BASIL", "BAYLEAF", "GREENBEAN", "LIMABEAN", "WHITEBEAN", "BEEFIGE", "REDBEET", "BLUEBERRY", "BROCCOLI", "CABBAGE", "MELON", "CARROT", "CASEIN", "CASHEWNUT", "CAULIFLOWER", "CELERY"  Note: Lab results reviewed.  PFSH  Drug: Paul [name]  reports no history of drug use. Alcohol:  reports current alcohol use of about 18.0 standard drinks of alcohol per week. Tobacco:  reports that he has never smoked. He has never been exposed to tobacco smoke. He has never used smokeless tobacco. Medical:  has a past medical history of Hypertension, Primary hypertension (12/11/2020), and Vitamin D deficiency. Family: family history includes Cancer in his father; Healthy in his mother; Lung cancer in his father.  Past Surgical History:  Procedure  Laterality Date   APPENDECTOMY     HERNIA REPAIR     Active Ambulatory Problems    Diagnosis Date Noted   Essential hypertension, benign 07/31/2019   Hyperlipidemia 07/31/2019   Vitamin D deficiency 07/31/2019   Erectile dysfunction 12/11/2020   Myofascial pain syndrome of lumbar spine 03/04/2021   Spondylosis without myelopathy or radiculopathy, lumbar region 03/04/2021   Lumbar degenerative disc disease 03/04/2022   Encounter for examination required by Department of Transportation (DOT) 03/21/2023   Resolved Ambulatory Problems    Diagnosis Date Noted   Breast mass in male 08/01/2019   Primary hypertension 12/11/2020   Screening for lipid disorders 12/11/2020   Past Medical History:  Diagnosis Date   Hypertension    Constitutional Exam  General appearance: Well nourished, well developed, and well hydrated. In no apparent acute distress There were no vitals filed for this visit. BMI Assessment: Estimated body mass index is 40.65 kg/m as calculated from the following:   Height as of 03/21/23: 5\' 10"  (1.778 m).   Weight as of 03/21/23: 283 lb 4.8 oz (128.5 kg).  BMI interpretation table: BMI level Category Range association with higher incidence of chronic pain  <18 kg/m2 Underweight   18.5-24.9 kg/m2 Ideal body weight   25-29.9 kg/m2 Overweight Increased incidence by 20%  30-34.9 kg/m2 Obese (Class I) Increased incidence by 68%  35-39.9 kg/m2 Severe obesity (Class II) Increased incidence by 136%  >40 kg/m2 Extreme obesity (Class III) Increased incidence by 254%   Patient's current BMI Ideal Body weight  There is no height or weight on file to calculate BMI. Ideal body weight: 73 kg (160 lb 15 oz) Adjusted ideal body weight: 95.2 kg (209 lb 14.1 oz)   BMI Readings from Last 4 Encounters:  03/21/23 40.65 kg/m  03/06/23 40.18 kg/m  11/08/22 40.03 kg/m  05/03/22 40.41 kg/m   Wt Readings from Last 4 Encounters:  03/21/23 283 lb 4.8 oz (128.5 kg)  03/06/23 280 lb  (127 kg)  11/08/22 279 lb (126.6 kg)  05/03/22 281 lb 9.6 oz (127.7 kg)    Psych/Mental status: Alert, oriented x 3 (person, place, & time)       Eyes: PERLA Respiratory: No evidence of acute respiratory distress  Assessment  Primary Diagnosis & Pertinent Problem List: There were no encounter diagnoses.  Visit Diagnosis (New problems to examiner): No diagnosis found. Plan of Care (Initial workup plan)  Note: Paul [name] was reminded that as per protocol, today's visit has been an evaluation only. We have not taken over the patient's controlled substance management.  Problem-specific plan: Assessment and Plan            Lab Orders  No laboratory test(s) ordered today   Imaging Orders  No imaging studies ordered today   Referral Orders  No referral(s) requested today   Procedure Orders    No procedure(s) ordered today   Pharmacotherapy (current): Medications ordered:  No orders of the  defined types were placed in this encounter.  Medications administered during this visit: Paul [name] had no medications administered during this visit.   Analgesic Pharmacotherapy:  Opioid Analgesics: For patients currently taking or requesting to take opioid analgesics, in accordance with [facility] Guidelines, we will assess their risks and indications for the use of these substances. After completing our evaluation, we may offer recommendations, but we no longer take patients for medication management. The prescribing physician will ultimately decide, based on his/her training and level of comfort whether to adopt any of the recommendations, including whether or not to prescribe such medicines.  Membrane stabilizer: To be determined at a later time  Muscle relaxant: To be determined at a later time  NSAID: To be determined at a later time  Other analgesic(s): To be determined at a later time   Interventional management options: Paul [name] was informed that there  is no guarantee that he would be a candidate for interventional therapies. The decision will be based on the results of diagnostic studies, as well as Paul [name]'s risk profile.  Procedure(s) under consideration:  Pending results of ordered studies      Interventional Therapies  Risk Factors  Considerations  Medical Comorbidities:     Planned  Pending:      Under consideration:   Pending   Completed:   None at this time   Therapeutic  Palliative (PRN) options:   None established   Completed by other providers:   None reported       Provider-requested follow-up: No follow-ups on file.  Future Appointments  Date Time Provider Department Center  04/03/2023  8:00 AM Delano Metz, MD [facility] None  05/09/2023  9:20 AM Marjie Skiff, NP [facility]    Duration of encounter: *** minutes.  Total time on encounter, as per AMA guidelines included both the face-to-face and non-face-to-face time personally spent by the physician and/or other qualified health care professional(s) on the day of the encounter (includes time in activities that require the physician or other qualified health care professional and does not include time in activities normally performed by clinical staff). Physician's time may include the following activities when performed: Preparing to see the patient (e.g., pre-charting review of records, searching for previously ordered imaging, lab work, and nerve conduction tests) Review of prior analgesic pharmacotherapies. Reviewing PMP Interpreting ordered tests (e.g., lab work, imaging, nerve conduction tests) Performing post-procedure evaluations, including interpretation of diagnostic procedures Obtaining and/or reviewing separately obtained history Performing a medically appropriate examination and/or evaluation Counseling and educating the patient/family/caregiver Ordering medications, tests, or procedures Referring and communicating with other  health care professionals (when not separately reported) Documenting clinical information in the electronic or other health record Independently interpreting results (not separately reported) and communicating results to the patient/ family/caregiver Care coordination (not separately reported)  Note by: Oswaldo Done, MD (AI and TTS technology used. I apologize for any typographical errors that were not detected and corrected.) Date: 04/03/2023; Time: 7:31 AM

## 2023-04-02 NOTE — Patient Instructions (Incomplete)
 ______________________________________________________________________    New Patients  Welcome to [facility].   Initial Visit The first or initial visit consists of an evaluation only.   Interventional pain management.  We offer therapies other than opioid controlled substances to manage chronic pain. These include, but are not limited to, diagnostic, therapeutic, and palliative specialized injection therapies (i.e.: Epidural Steroids, Facet Blocks, etc.). We specialize in a variety of nerve blocks as well as radiofrequency treatments. We offer pain implant evaluations and trials, as well as follow up management. In addition we also provide a variety joint injections, including Viscosupplementation (AKA: Gel Therapy).  Prescription Pain Medication. We specialize in alternatives to opioids. We can provide evaluations and recommendations for/of pharmacologic therapies based on CDC Guidelines.  We no longer take patients for long-term medication management. We will not be taking over your pain medications.  ______________________________________________________________________      ______________________________________________________________________    Patient Information update  To: All of our patients.  Re: Name change.  It has been made official that our current name, "[facility]"   will soon be changed to "[facility]".   The purpose of this change is to eliminate any confusion created by the concept of our practice being a "Medication Management Pain Clinic". In the past this has led to the misconception that we treat pain primarily by the use of prescription medications.  Nothing can be farther from the truth.   Understanding PAIN MANAGEMENT: To further understand what our practice does, you first have to  understand that "Pain Management" is a subspecialty that requires additional training once a physician has completed their specialty training, which can be in either Anesthesia, Neurology, Psychiatry, or Physical Medicine and Rehabilitation (PMR). Each one of these contributes to the final approach taken by each physician to the management of their patient's pain. To be a "Pain Management Specialist" you must have first completed one of the specialty trainings below.  Anesthesiologists - trained in clinical pharmacology and interventional techniques such as nerve blockade and regional as well as central neuroanatomy. They are trained to block pain before, during, and after surgical interventions.  Neurologists - trained in the diagnosis and pharmacological treatment of complex neurological conditions, such as Multiple Sclerosis, Parkinson's, spinal cord injuries, and other systemic conditions that may be associated with symptoms that may include but are not limited to pain. They tend to rely primarily on the treatment of chronic pain using prescription medications.  Psychiatrist - trained in conditions affecting the psychosocial wellbeing of patients including but not limited to depression, anxiety, schizophrenia, personality disorders, addiction, and other substance use disorders that may be associated with chronic pain. They tend to rely primarily on the treatment of chronic pain using prescription medications.   Physical Medicine and Rehabilitation (PMR) physicians, also known as physiatrists - trained to treat a wide variety of medical conditions affecting the brain, spinal cord, nerves, bones, joints, ligaments, muscles, and tendons. Their training is primarily aimed at treating patients that have suffered injuries that have caused severe physical impairment. Their training is primarily aimed at the physical therapy and rehabilitation of those patients. They may also work alongside orthopedic surgeons  or neurosurgeons using their expertise in assisting surgical patients to recover after their surgeries.  INTERVENTIONAL PAIN MANAGEMENT is sub-subspecialty of Pain Management.  Our physicians are Board-certified in Anesthesia, Pain Management, and Interventional Pain Management.  This meaning that  not only have they been trained and Board-certified in their specialty of Anesthesia, and subspecialty of Pain Management, but they have also received further training in the sub-subspecialty of Interventional Pain Management, in order to become Board-certified as INTERVENTIONAL PAIN MANAGEMENT SPECIALIST.    Mission: Our goal is to use our skills in  INTERVENTIONAL PAIN MANAGEMENT as alternatives to the chronic use of prescription opioid medications for the treatment of pain. To make this more clear, we have changed our name to reflect what we do and offer. We will continue to offer medication management assessment and recommendations, but we will not be taking over any patient's medication management.  ______________________________________________________________________      ______________________________________________________________________    Procedure instructions  Stop blood-thinners  Do not eat or drink fluids (other than water) for 6 hours before your procedure  No water for 2 hours before your procedure  Take your blood pressure medicine with a sip of water  Arrive 30 minutes before your appointment  If sedation is planned, bring suitable driver. Pennie Banter, Benedetto Goad, & public transportation are NOT APPROVED)  Carefully read the "Preparing for your procedure" detailed instructions  If you have questions call us at [phone]  Procedure appointments are for procedures only. NO medication refills or new problem evaluations.   ______________________________________________________________________      ______________________________________________________________________     Preparing for your procedure  Appointments: If you think you may not be able to keep your appointment, call 24-48 hours in advance to cancel. We need time to make it available to others.  Procedure visits are for procedures only. During your procedure appointment there will be: NO Prescription Refills*. NO medication changes or discussions*. NO discussion of disability issues*. NO unrelated pain problem evaluations*. NO evaluations to order other pain procedures*. *These will be addressed at a separate and distinct evaluation encounter on the provider's evaluation schedule and not during procedure days.  Instructions: Food intake: Avoid eating anything solid for at least 8 hours prior to your procedure. Clear liquid intake: You may take clear liquids such as water up to 2 hours prior to your procedure. (No carbonated drinks. No soda.) Transportation: Unless otherwise stated by your physician, bring a driver. (Driver cannot be a Market researcher, Pharmacist, community, or any other form of public transportation.) Morning Medicines: Except for blood thinners, take all of your other morning medications with a sip of water. Make sure to take your heart and blood pressure medicines. If your blood pressure's lower number is above 100, the case will be rescheduled. Blood thinners: Make sure to stop your blood thinners as instructed.  If you take a blood thinner, but were not instructed to stop it, call our office [phone] and ask to talk to a nurse. Not stopping a blood thinner prior to certain procedures could lead to serious complications. Diabetics on insulin: Notify the staff so that you can be scheduled 1st case in the morning. If your diabetes requires high dose insulin, take only  of your normal insulin dose the morning of the procedure and notify the staff that you have done so. Preventing infections: Shower with an antibacterial soap the morning of your procedure.  Build-up your immune system: Take 1000 mg of  Vitamin C with every meal (3 times a day) the day prior to your procedure. Antibiotics: Inform the nursing staff if you are taking any antibiotics or if you have any conditions that may require antibiotics prior to procedures. (Example: recent joint implants)   Pregnancy: If you  are pregnant make sure to notify the nursing staff. Not doing so may result in injury to the fetus, including death.  Sickness: If you have a cold, fever, or any active infections, call and cancel or reschedule your procedure. Receiving steroids while having an infection may result in complications. Arrival: You must be in the facility at least 30 minutes prior to your scheduled procedure. Tardiness: Your scheduled time is also the cutoff time. If you do not arrive at least 15 minutes prior to your procedure, you will be rescheduled.  Children: Do not bring any children with you. Make arrangements to keep them home. Dress appropriately: There is always a possibility that your clothing may get soiled. Avoid long dresses. Valuables: Do not bring any jewelry or valuables.  Reasons to call and reschedule or cancel your procedure: (Following these recommendations will minimize the risk of a serious complication.) Surgeries: Avoid having procedures within 2 weeks of any surgery. (Avoid for 2 weeks before or after any surgery). Flu Shots: Avoid having procedures within 2 weeks of a flu shots or . (Avoid for 2 weeks before or after immunizations). Barium: Avoid having a procedure within 7-10 days after having had a radiological study involving the use of radiological contrast. (Myelograms, Barium swallow or enema study). Heart attacks: Avoid any elective procedures or surgeries for the initial 6 months after a "Myocardial Infarction" (Heart Attack). Blood thinners: It is imperative that you stop these medications before procedures. Let us know if you if you take any blood thinner.  Infection: Avoid procedures during or within two  weeks of an infection (including chest colds or gastrointestinal problems). Symptoms associated with infections include: Localized redness, fever, chills, night sweats or profuse sweating, burning sensation when voiding, cough, congestion, stuffiness, runny nose, sore throat, diarrhea, nausea, vomiting, cold or Flu symptoms, recent or current infections. It is specially important if the infection is over the area that we intend to treat. Heart and lung problems: Symptoms that may suggest an active cardiopulmonary problem include: cough, chest pain, breathing difficulties or shortness of breath, dizziness, ankle swelling, uncontrolled high or unusually low blood pressure, and/or palpitations. If you are experiencing any of these symptoms, cancel your procedure and contact your primary care physician for an evaluation.  Remember:  Regular Business hours are:  Monday to Thursday 8:00 AM to 4:00 PM  Provider's Schedule: Delano Metz, MD:  Procedure days: Tuesday and Thursday 7:30 AM to 4:00 PM  Edward Jolly, MD:  Procedure days: Monday and Wednesday 7:30 AM to 4:00 PM Last  Updated: 12/20/2022 ______________________________________________________________________      ______________________________________________________________________    General Risks and Possible Complications  Patient Responsibilities: It is important that you read this as it is part of your informed consent. It is our duty to inform you of the risks and possible complications associated with treatments offered to you. It is your responsibility as a patient to read this and to ask questions about anything that is not clear or that you believe was not covered in this document.  Patient's Rights: You have the right to refuse treatment. You also have the right to change your mind, even after initially having agreed to have the treatment done. However, under this last option, if you wait until the last second to change your  mind, you may be charged for the materials used up to that point.  Introduction: Medicine is not an Visual merchandiser. Everything in Medicine, including the lack of treatment(s), carries the potential for danger, harm,  or loss (which is by definition: Risk). In Medicine, a complication is a secondary problem, condition, or disease that can aggravate an already existing one. All treatments carry the risk of possible complications. The fact that a side effects or complications occurs, does not imply that the treatment was conducted incorrectly. It must be clearly understood that these can happen even when everything is done following the highest safety standards.  No treatment: You can choose not to proceed with the proposed treatment alternative. The "PRO(s)" would include: avoiding the risk of complications associated with the therapy. The "CON(s)" would include: not getting any of the treatment benefits. These benefits fall under one of three categories: diagnostic; therapeutic; and/or palliative. Diagnostic benefits include: getting information which can ultimately lead to improvement of the disease or symptom(s). Therapeutic benefits are those associated with the successful treatment of the disease. Finally, palliative benefits are those related to the decrease of the primary symptoms, without necessarily curing the condition (example: decreasing the pain from a flare-up of a chronic condition, such as incurable terminal cancer).  General Risks and Complications: These are associated to most interventional treatments. They can occur alone, or in combination. They fall under one of the following six (6) categories: no benefit or worsening of symptoms; bleeding; infection; nerve damage; allergic reactions; and/or death. No benefits or worsening of symptoms: In Medicine there are no guarantees, only probabilities. No healthcare provider can ever guarantee that a medical treatment will work, they can only state  the probability that it may. Furthermore, there is always the possibility that the condition may worsen, either directly, or indirectly, as a consequence of the treatment. Bleeding: This is more common if the patient is taking a blood thinner, either prescription or over the counter (example: Goody Powders, Fish oil, Aspirin, Garlic, etc.), or if suffering a condition associated with impaired coagulation (example: Hemophilia, cirrhosis of the liver, low platelet counts, etc.). However, even if you do not have one on these, it can still happen. If you have any of these conditions, or take one of these drugs, make sure to notify your treating physician. Infection: This is more common in patients with a compromised immune system, either due to disease (example: diabetes, cancer, human immunodeficiency virus [HIV], etc.), or due to medications or treatments (example: therapies used to treat cancer and rheumatological diseases). However, even if you do not have one on these, it can still happen. If you have any of these conditions, or take one of these drugs, make sure to notify your treating physician. Nerve Damage: This is more common when the treatment is an invasive one, but it can also happen with the use of medications, such as those used in the treatment of cancer. The damage can occur to small secondary nerves, or to large primary ones, such as those in the spinal cord and brain. This damage may be temporary or permanent and it may lead to impairments that can range from temporary numbness to permanent paralysis and/or brain death. Allergic Reactions: Any time a substance or material comes in contact with our body, there is the possibility of an allergic reaction. These can range from a mild skin rash (contact dermatitis) to a severe systemic reaction (anaphylactic reaction), which can result in death. Death: In general, any medical intervention can result in death, most of the time due to an unforeseen  complication. ______________________________________________________________________

## 2023-04-03 ENCOUNTER — Ambulatory Visit
Admission: RE | Admit: 2023-04-03 | Discharge: 2023-04-03 | Disposition: A | Discharge status: Home / Self Care | Source: Ambulatory Visit | Attending: Pain Medicine | Admitting: Pain Medicine

## 2023-04-03 ENCOUNTER — Encounter: Payer: Self-pay | Admitting: Pain Medicine

## 2023-04-03 ENCOUNTER — Ambulatory Visit: Admitting: Pain Medicine

## 2023-04-03 VITALS — BP 136/85 | HR 66 | Temp 98.6°F | Resp 16 | Ht 70.0 in | Wt 281.0 lb

## 2023-04-03 DIAGNOSIS — M899 Disorder of bone, unspecified: Secondary | ICD-10-CM

## 2023-04-03 DIAGNOSIS — M5459 Other low back pain: Secondary | ICD-10-CM | POA: Insufficient documentation

## 2023-04-03 DIAGNOSIS — M545 Low back pain, unspecified: Secondary | ICD-10-CM | POA: Insufficient documentation

## 2023-04-03 DIAGNOSIS — M47816 Spondylosis without myelopathy or radiculopathy, lumbar region: Secondary | ICD-10-CM

## 2023-04-03 DIAGNOSIS — G894 Chronic pain syndrome: Secondary | ICD-10-CM

## 2023-04-03 DIAGNOSIS — R937 Abnormal findings on diagnostic imaging of other parts of musculoskeletal system: Secondary | ICD-10-CM

## 2023-04-03 DIAGNOSIS — M51369 Other intervertebral disc degeneration, lumbar region without mention of lumbar back pain or lower extremity pain: Secondary | ICD-10-CM

## 2023-04-03 DIAGNOSIS — E559 Vitamin D deficiency, unspecified: Secondary | ICD-10-CM

## 2023-04-03 DIAGNOSIS — Z789 Other specified health status: Secondary | ICD-10-CM | POA: Insufficient documentation

## 2023-04-03 DIAGNOSIS — Z79899 Other long term (current) drug therapy: Secondary | ICD-10-CM | POA: Insufficient documentation

## 2023-04-03 DIAGNOSIS — G8929 Other chronic pain: Secondary | ICD-10-CM

## 2023-04-05 LAB — COMP. METABOLIC PANEL (12)
AST: 24 IU/L (ref 0–40)
Albumin: 4.6 g/dL (ref 3.8–4.9)
Alkaline Phosphatase: 86 IU/L (ref 44–121)
BUN/Creatinine Ratio: 18 (ref 9–20)
BUN: 17 mg/dL (ref 6–24)
Bilirubin Total: 0.6 mg/dL (ref 0.0–1.2)
Calcium: 9.2 mg/dL (ref 8.7–10.2)
Chloride: 99 mmol/L (ref 96–106)
Creatinine, Ser: 0.92 mg/dL (ref 0.76–1.27)
Globulin, Total: 2.5 g/dL (ref 1.5–4.5)
Glucose: 106 mg/dL — ABNORMAL HIGH (ref 70–99)
Potassium: 4.3 mmol/L (ref 3.5–5.2)
Sodium: 135 mmol/L (ref 134–144)
Total Protein: 7.1 g/dL (ref 6.0–8.5)
eGFR: 99 mL/min/{1.73_m2} (ref >59)

## 2023-04-05 LAB — MAGNESIUM: Magnesium: 2.1 mg/dL (ref 1.6–2.3)

## 2023-04-05 LAB — SEDIMENTATION RATE: Sed Rate: 7 mm/h (ref 0–30)

## 2023-04-05 LAB — COMPLIANCE DRUG ANALYSIS, UR

## 2023-04-05 LAB — C-REACTIVE PROTEIN: CRP: 1 mg/L (ref 0–10)

## 2023-04-05 LAB — VITAMIN B12: Vitamin B-12: 374 pg/mL (ref 232–1245)

## 2023-04-10 LAB — 25-HYDROXY VITAMIN D LCMS D2+D3
25-Hydroxy, Vitamin D-2: <1 ng/mL
25-Hydroxy, Vitamin D-3: 19 ng/mL
25-Hydroxy, Vitamin D: 19 ng/mL — ABNORMAL LOW

## 2023-04-16 NOTE — Patient Instructions (Incomplete)

## 2023-04-16 NOTE — Progress Notes (Unsigned)
 PROVIDER NOTE: Interpretation of information contained herein should be left to medically-trained personnel. Specific patient instructions are provided elsewhere under "Patient Instructions" section of medical record. This document was created in part using AI and STT-dictation technology, any transcriptional errors that may result from this process are unintentional.  Patient: Paul [name]  Service: E/M   PCP: Marjie Skiff, NP  DOB: 06-07-1970  DOS: 04/17/2023  Provider: Oswaldo Done, MD  MRN: [id number]  Delivery: Face-to-face  Specialty: Interventional Pain Management  Type: Established Patient  Setting: Ambulatory outpatient facility  Specialty designation: 09  Referring Prov.: Marjie Skiff, NP  Location: Outpatient office facility       Primary Reason(s) for Visit: Encounter for evaluation before starting new chronic pain management plan of care (Level of risk: moderate) CC: No chief complaint on file.  HPI  Paul [name] is a 53 y.o. year old, male patient, who comes today for a follow-up evaluation to review the test results and decide on a treatment plan. He has Essential hypertension, benign; Hyperlipidemia; Vitamin D deficiency; Erectile dysfunction; Myofascial pain syndrome of lumbar spine; Spondylosis without myelopathy or radiculopathy, lumbar region; Encounter for examination required by Department of Transportation (DOT); Chronic pain syndrome; Pharmacologic therapy; Disorder of skeletal system; Problems influencing health status; Abnormal MRI, lumbar spine (12/28/2019); DDD (degenerative disc disease), lumbar w/o LBP or LEP; Lumbar facet joint pain (Bilateral) (R>L); Lumbar facet joint arthropathy; Chronic low back pain (1ry area of Pain) (Bilateral) (R>L) w/o sciatica; Mechanical low back pain; and Low back pain of over 3 months duration on their problem list. His primarily concern today is the No chief complaint on file.  Pain Assessment: Location:     Radiating:    Onset:   Duration:   Quality:   Severity:  /10 (subjective, self-reported pain score)  Effect on ADL:   Timing:   Modifying factors:   BP:    HR:    [name] comes in today for a follow-up visit after his initial evaluation on 04/03/2023. Today we went over the results of his tests. These were explained in "Layman's terms". During today's appointment we went over my diagnostic impression, as well as the proposed treatment plan.  ***:"Paul [name] is a 53 year old male with chronic lower back pain who presents with worsening symptoms.   He has experienced chronic lower back pain for approximately 15 years, with worsening symptoms over the past six months. The pain is primarily located on the right side of his lower back and is described as deep, making it difficult to alleviate with muscle rubs. Prolonged sitting or standing exacerbates the pain, while movement provides some relief. Sleeping has become difficult as the pain often wakes him up, requiring frequent position changes.   An MRI conducted in 2021 revealed arthritis in the facet joints of his spine, particularly affecting the L4, L5, and L5-S1 levels. He uses a pad in his right boot due to a leg length discrepancy diagnosed 30 years ago, which may contribute to his back pain. He also mentions a vitamin D deficiency identified in previous lab work, which could be influencing his pain perception.   He has a history of working in [employer], which he believes contributed to the gradual onset of his back pain. He denies any specific injuries or falls that could have triggered the pain.   No leg numbness, weakness, or shooting pain down the legs. He experiences a pulling sensation on the right side of his back during certain  movements."  ***  Discussed the use of AI scribe software for clinical note transcription with the patient, who gave verbal consent to proceed.  History of Present Illness          Patient presented with  interventional treatment options. Mr. Crescenzo was informed that I will not be providing medication management. Pharmacotherapy evaluation including recommendations may be offered, if specifically requested.   Controlled Substance Pharmacotherapy Assessment REMS (Risk Evaluation and Mitigation Strategy)  Opioid Analgesic: No chronic opioid analgesics therapy prescribed by our practice. None. MME/day: 0 mg/day  Pill Count: None expected due to no prior prescriptions written by our practice. No notes on file Pharmacokinetics: Liberation and absorption (onset of action): WNL Distribution (time to peak effect): WNL Metabolism and excretion (duration of action): WNL         Pharmacodynamics: Desired effects: Analgesia: Mr. Nephew reports >50% benefit. Functional ability: Patient reports that medication allows him to accomplish basic ADLs Clinically meaningful improvement in function (CMIF): Sustained CMIF goals met Perceived effectiveness: Described as relatively effective, allowing for increase in activities of daily living (ADL) Undesirable effects: Side-effects or Adverse reactions: None reported Monitoring: [place] PMP: PDMP reviewed during this encounter. Online review of the past 14-month period previously conducted. Not applicable at this point since we have not taken over the patient's medication management yet. List of other Serum/Urine Drug Screening Test(s):  No results found for: "AMPHSCRSER", "BARBSCRSER", "BENZOSCRSER", "COCAINSCRSER", "COCAINSCRNUR", "PCPSCRSER", "THCSCRSER", "THCU", "CANNABQUANT", "OPIATESCRSER", "OXYSCRSER", "PROPOXSCRSER", "ETH", "CBDTHCR", "D8THCCBX", "D9THCCBX" List of all UDS test(s) done:  Lab Results  Component Value Date   SUMMARY FINAL 04/03/2023   Last UDS on record: Summary  Date Value Ref Range Status  04/03/2023 FINAL  Final    Comment:    ==================================================================== Compliance Drug Analysis,  Ur ==================================================================== Test                             Result       Flag       Units    NO DRUGS DETECTED. ==================================================================== Test                      Result    Flag   Units      Ref Range   Creatinine              83               mg/dL      >=16 ==================================================================== Declared Medications:  The flagging and interpretation on this report are based on the  following declared medications.  Unexpected results may arise from  inaccuracies in the declared medications.   **Note: The testing scope of this panel does not include the  following reported medications:   Cholecalciferol  Hydrochlorothiazide (Zestoretic)  Lisinopril (Zestoretic)  Meloxicam (Mobic)  Multivitamin  Tadalafil (Cialis) ==================================================================== For clinical consultation, please call [phone]. ====================================================================    UDS interpretation: No unexpected findings.          Medication Assessment Form: Not applicable. No opioids. Treatment compliance: Not applicable Risk Assessment Profile: Aberrant behavior: See initial evaluations. None observed or detected today Comorbid factors increasing risk of overdose: See initial evaluation. No additional risks detected today Opioid risk tool (ORT):     04/03/2023    8:13 AM  Opioid Risk   Alcohol 0  Illegal Drugs 0  Rx Drugs 0  Alcohol 0  Illegal Drugs 0  Rx  Drugs 0  Age between 16-45 years  0  History of Preadolescent Sexual Abuse 0  Psychological Disease 0  Depression 0  Opioid Risk Tool Scoring 0  Opioid Risk Interpretation Low Risk    ORT Scoring interpretation table:  Score <3 = Low Risk for SUD  Score between 4-7 = Moderate Risk for SUD  Score >8 = High Risk for Opioid Abuse   Risk of substance use disorder  (SUD): Low  Risk Mitigation Strategies:  Patient opioid safety counseling: No controlled substances prescribed. Patient-Prescriber Agreement (PPA): No agreement signed.  Controlled substance notification to other providers: None required. No opioid therapy.  Pharmacologic Plan: Non-opioid analgesic therapy offered. Interventional alternatives discussed.             Laboratory Chemistry Profile   Renal Lab Results  Component Value Date   BUN 17 04/03/2023   CREATININE 0.92 04/03/2023   BCR 18 04/03/2023   GFRAA 90 02/19/2020   GFRNONAA 77 02/19/2020   SPECGRAV 1.020 11/26/2020   PHUR 6.0 11/26/2020   PROTEINUR Negative 11/26/2020     Electrolytes Lab Results  Component Value Date   NA 135 04/03/2023   K 4.3 04/03/2023   CL 99 04/03/2023   CALCIUM 9.2 04/03/2023   MG 2.1 04/03/2023     Hepatic Lab Results  Component Value Date   AST 24 04/03/2023   ALT 29 11/08/2022   ALBUMIN 4.6 04/03/2023   ALKPHOS 86 04/03/2023     ID Lab Results  Component Value Date   HIV Non Reactive 05/03/2022     Bone Lab Results  Component Value Date   VD25OH 27.2 (L) 05/03/2022   25OHVITD1 19 (L) 04/03/2023   25OHVITD2 <1.0 04/03/2023   25OHVITD3 19 04/03/2023   TESTOFREE 201.2 12/17/2019   TESTOSTERONE 873 (H) 12/17/2019     Endocrine Lab Results  Component Value Date   GLUCOSE 106 (H) 04/03/2023   GLUCOSEU Negative 11/26/2020   HGBA1C 4.5 (L) 04/01/2021   TSH 1.840 10/05/2021   TESTOFREE 201.2 12/17/2019   TESTOSTERONE 873 (H) 12/17/2019   SHBG 19 12/17/2019     Neuropathy Lab Results  Component Value Date   VITAMINB12 374 04/03/2023   HGBA1C 4.5 (L) 04/01/2021   HIV Non Reactive 05/03/2022     CNS No results found for: "COLORCSF", "APPEARCSF", "RBCCOUNTCSF", "WBCCSF", "POLYSCSF", "LYMPHSCSF", "EOSCSF", "PROTEINCSF", "GLUCCSF", "JCVIRUS", "CSFOLI", "IGGCSF", "LABACHR", "ACETBL"   Inflammation (CRP: Acute  ESR: Chronic) Lab Results  Component Value Date   CRP  1 04/03/2023   ESRSEDRATE 7 04/03/2023     Rheumatology No results found for: "RF", "ANA", "LABURIC", "URICUR", "LYMEIGGIGMAB", "LYMEABIGMQN", "HLAB27"   Coagulation Lab Results  Component Value Date   PLT 152 10/05/2021     Cardiovascular Lab Results  Component Value Date   HGB 15.2 10/05/2021   HCT 44.4 10/05/2021     Screening Lab Results  Component Value Date   HIV Non Reactive 05/03/2022     Cancer No results found for: "CEA", "CA125", "LABCA2"   Allergens No results found for: "ALMOND", "APPLE", "ASPARAGUS", "AVOCADO", "BANANA", "BARLEY", "BASIL", "BAYLEAF", "GREENBEAN", "LIMABEAN", "WHITEBEAN", "BEEFIGE", "REDBEET", "BLUEBERRY", "BROCCOLI", "CABBAGE", "MELON", "CARROT", "CASEIN", "CASHEWNUT", "CAULIFLOWER", "CELERY"     Note: Lab results reviewed.  Recent Diagnostic Imaging Review  Lumbosacral Imaging: Lumbar MR wo contrast: Results for orders placed in visit on 12/17/19 MR Lumbar Spine Wo Contrast  Narrative CLINICAL DATA:  Low back pain and numbness for 2 years.  EXAM: MRI LUMBAR SPINE WITHOUT CONTRAST  TECHNIQUE:  Multiplanar, multisequence MR imaging of the lumbar spine was performed. No intravenous contrast was administered.  COMPARISON:  None.  FINDINGS: Segmentation:  Standard.  Alignment:  Maintained.  Vertebrae: No fracture, evidence of discitis, or bone lesion. Scattered, mild degenerative endplate signal change noted.  Conus medullaris and cauda equina: Conus extends to the L1 level. Conus and cauda equina appear normal.  Paraspinal and other soft tissues: Negative.  Disc levels:  T12-L1 is imaged in the sagittal plane only and negative.  L1-2: Negative.  L2-3: Negative.  L3-4: Shallow disc bulge without stenosis.  L4-5: Shallow disc bulge and mild facet degenerative change without stenosis.  L5-S1: Shallow disc bulge without stenosis.  IMPRESSION: Mild lumbar degenerative change without central canal or  foraminal stenosis.   Electronically Signed By: Drusilla Kanner M.D. On: 12/28/2019 15:58  Lumbar DG Bending views: Results for orders placed during the hospital encounter of 04/03/23 DG Lumbar Spine Complete W/Bend  Narrative CLINICAL DATA:  Back pain  EXAM: LUMBAR SPINE - COMPLETE WITH BENDING VIEWS  COMPARISON:  MRI lumbar December 27, 2019  FINDINGS: Subtle dextroscoliosis  Multilevel degenerative disc disease with narrowing T12-L1 L1-L2, L3-L4. Subtle marginal anterolateral syndesmophytes osteophytes. With diffuse anterior osteophytes and mild posterior bony spondylosis.  There is advanced degenerative facet disease L3-L4 L4-5 and L5-S1  No spondylolysis or spondylolisthesis.  No obvious fractures  IMPRESSION: Multilevel degenerative disc disease and facet disease.   Electronically Signed By: Shaaron Adler M.D. On: 04/06/2023 14:44  Complexity Note: Imaging results reviewed.                         Meds   Current Outpatient Medications:    Cholecalciferol 1.25 MG (50000 UT) TABS, Take 2 tablets by mouth daily., Disp: , Rfl:    lisinopril-hydrochlorothiazide (ZESTORETIC) 20-25 MG tablet, TAKE 1 TABLET BY MOUTH DAILY, Disp: 90 tablet, Rfl: 0   meloxicam (MOBIC) 15 MG tablet, Take 1 tablet (15 mg total) by mouth daily., Disp: 30 tablet, Rfl: 2   MULTIPLE VITAMIN PO, Take by mouth. Takes 2 gummy vitamins a day, Disp: , Rfl:    tadalafil (CIALIS) 10 MG tablet, Take 1 tablet (10 mg total) by mouth daily., Disp: 90 tablet, Rfl: 3  ROS  Constitutional: Denies any fever or chills Gastrointestinal: No reported hemesis, hematochezia, vomiting, or acute GI distress Musculoskeletal: Denies any acute onset joint swelling, redness, loss of ROM, or weakness Neurological: No reported episodes of acute onset apraxia, aphasia, dysarthria, agnosia, amnesia, paralysis, loss of coordination, or loss of consciousness  Allergies  Mr. Canada has no known allergies.  PFSH   Drug: Mr. Mcclenny  reports no history of drug use. Alcohol:  reports current alcohol use of about 18.0 standard drinks of alcohol per week. Tobacco:  reports that he has never smoked. He has never been exposed to tobacco smoke. He has never used smokeless tobacco. Medical:  has a past medical history of Hypertension, Primary hypertension (12/11/2020), and Vitamin D deficiency. Surgical: Mr. Hiltz  has a past surgical history that includes Appendectomy and Hernia repair. Family: family history includes Cancer in his father; Healthy in his mother; Lung cancer in his father.  Constitutional Exam  General appearance: Well nourished, well developed, and well hydrated. In no apparent acute distress There were no vitals filed for this visit. BMI Assessment: Estimated body mass index is 40.32 kg/m as calculated from the following:   Height as of 04/03/23: 5\' 10"  (1.778 m).   Weight  as of 04/03/23: 281 lb (127.5 kg).  BMI interpretation table: BMI level Category Range association with higher incidence of chronic pain  <18 kg/m2 Underweight   18.5-24.9 kg/m2 Ideal body weight   25-29.9 kg/m2 Overweight Increased incidence by 20%  30-34.9 kg/m2 Obese (Class I) Increased incidence by 68%  35-39.9 kg/m2 Severe obesity (Class II) Increased incidence by 136%  >40 kg/m2 Extreme obesity (Class III) Increased incidence by 254%   Patient's current BMI Ideal Body weight  There is no height or weight on file to calculate BMI. Ideal body weight: 73 kg (160 lb 15 oz) Adjusted ideal body weight: 94.8 kg (208 lb 15.4 oz)   BMI Readings from Last 4 Encounters:  04/03/23 40.32 kg/m  03/21/23 40.65 kg/m  03/06/23 40.18 kg/m  11/08/22 40.03 kg/m   Wt Readings from Last 4 Encounters:  04/03/23 281 lb (127.5 kg)  03/21/23 283 lb 4.8 oz (128.5 kg)  03/06/23 280 lb (127 kg)  11/08/22 279 lb (126.6 kg)    Psych/Mental status: Alert, oriented x 3 (person, place, & time)       Eyes: PERLA Respiratory: No  evidence of acute respiratory distress  Assessment & Plan  Primary Diagnosis & Pertinent Problem List: The primary encounter diagnosis was Chronic low back pain (1ry area of Pain) (Bilateral) (R>L) w/o sciatica. Diagnoses of DDD (degenerative disc disease), lumbar w/o LBP or LEP, Low back pain of over 3 months duration, Mechanical low back pain, Lumbar facet joint pain (Bilateral) (R>L), Lumbar facet joint arthropathy, Spondylosis without myelopathy or radiculopathy, lumbar region, and Abnormal MRI, lumbar spine (12/28/2019) were also pertinent to this visit. Visit Diagnosis: 1. Chronic low back pain (1ry area of Pain) (Bilateral) (R>L) w/o sciatica   2. DDD (degenerative disc disease), lumbar w/o LBP or LEP   3. Low back pain of over 3 months duration   4. Mechanical low back pain   5. Lumbar facet joint pain (Bilateral) (R>L)   6. Lumbar facet joint arthropathy   7. Spondylosis without myelopathy or radiculopathy, lumbar region   8. Abnormal MRI, lumbar spine (12/28/2019)    Problems updated and reviewed during this visit: No problems updated.  Plan of Care  Assessment and Plan             Pharmacotherapy (Medications Ordered): No orders of the defined types were placed in this encounter.  Procedure Orders    No procedure(s) ordered today   Lab Orders  No laboratory test(s) ordered today   Imaging Orders  No imaging studies ordered today   Referral Orders  No referral(s) requested today    Pharmacological management:  Opioid Analgesics: I will not be prescribing any opioids at this time Membrane stabilizer: I will not be prescribing any at this time Muscle relaxant: I will not be prescribing any at this time NSAID: I will not be prescribing any at this time Other analgesic(s): I will not be prescribing any at this time      Interventional Therapies  Risk Factors  Considerations  Medical Comorbidities:     Planned  Pending:      Under consideration:    Pending   Completed:   None at this time   Therapeutic  Palliative (PRN) options:   None established   Completed by other providers:   None reported     Provider-requested follow-up: No follow-ups on file. Recent Visits Date Type Provider Dept  04/03/23 Office Visit Delano Metz, MD [facility]  Showing recent visits within  past 90 days and meeting all other requirements Future Appointments Date Type Provider Dept  04/17/23 Appointment Delano Metz, MD [facility]  Showing future appointments within next 90 days and meeting all other requirements   Primary Care Physician: Marjie Skiff, NP  Duration of encounter: *** minutes.  Total time on encounter, as per AMA guidelines included both the face-to-face and non-face-to-face time personally spent by the physician and/or other qualified health care professional(s) on the day of the encounter (includes time in activities that require the physician or other qualified health care professional and does not include time in activities normally performed by clinical staff). Physician's time may include the following activities when performed: Preparing to see the patient (e.g., pre-charting review of records, searching for previously ordered imaging, lab work, and nerve conduction tests) Review of prior analgesic pharmacotherapies. Reviewing PMP Interpreting ordered tests (e.g., lab work, imaging, nerve conduction tests) Performing post-procedure evaluations, including interpretation of diagnostic procedures Obtaining and/or reviewing separately obtained history Performing a medically appropriate examination and/or evaluation Counseling and educating the patient/family/caregiver Ordering medications, tests, or procedures Referring and communicating with other health care professionals (when not separately reported) Documenting clinical information in the electronic or other health  record Independently interpreting results (not separately reported) and communicating results to the patient/ family/caregiver Care coordination (not separately reported)  Note by: Oswaldo Done, MD (TTS technology used. I apologize for any typographical errors that were not detected and corrected.) Date: 04/17/2023; Time: 1:23 PM

## 2023-04-17 ENCOUNTER — Encounter: Payer: Self-pay | Admitting: Pain Medicine

## 2023-04-17 ENCOUNTER — Ambulatory Visit: Attending: Pain Medicine | Admitting: Pain Medicine

## 2023-04-17 VITALS — BP 127/79 | HR 92 | Temp 98.8°F | Resp 16 | Ht 70.0 in | Wt 281.0 lb

## 2023-04-17 DIAGNOSIS — M47816 Spondylosis without myelopathy or radiculopathy, lumbar region: Secondary | ICD-10-CM | POA: Diagnosis not present

## 2023-04-17 DIAGNOSIS — E559 Vitamin D deficiency, unspecified: Secondary | ICD-10-CM | POA: Diagnosis not present

## 2023-04-17 DIAGNOSIS — M5459 Other low back pain: Secondary | ICD-10-CM

## 2023-04-17 DIAGNOSIS — R937 Abnormal findings on diagnostic imaging of other parts of musculoskeletal system: Secondary | ICD-10-CM | POA: Diagnosis not present

## 2023-04-17 DIAGNOSIS — M51369 Other intervertebral disc degeneration, lumbar region without mention of lumbar back pain or lower extremity pain: Secondary | ICD-10-CM

## 2023-04-17 DIAGNOSIS — M545 Low back pain, unspecified: Secondary | ICD-10-CM | POA: Diagnosis not present

## 2023-04-17 DIAGNOSIS — G8929 Other chronic pain: Secondary | ICD-10-CM | POA: Diagnosis not present

## 2023-04-17 MED ORDER — ERGOCALCIFEROL 1.25 MG (50000 UT) PO CAPS: 50000.0000 [IU] | ORAL_CAPSULE | ORAL | 1 refills | Status: AC

## 2023-04-17 MED ORDER — VITAMIN D3 125 MCG (5000 UT) PO CAPS: 5000.0000 [IU] | ORAL_CAPSULE | Freq: Every day | ORAL | 0 refills | Status: AC

## 2023-04-17 NOTE — Progress Notes (Signed)
 Safety precautions to be maintained throughout the outpatient stay will include: orient to surroundings, keep bed in low position, maintain call bell within reach at all times, provide assistance with transfer out of bed and ambulation.

## 2023-04-23 ENCOUNTER — Other Ambulatory Visit: Payer: Self-pay | Admitting: Nurse Practitioner

## 2023-04-25 NOTE — Telephone Encounter (Signed)
 Requested by interface surescripts. Last OV 11/08/22 courtesy refill . Future visit in 2 weeks.  Requested Prescriptions  Pending Prescriptions Disp Refills   lisinopril-hydrochlorothiazide (ZESTORETIC) 20-25 MG tablet [Pharmacy Med Name: LISINOPRIL-HCTZ 20/25MG  TABLETS] 90 tablet 0    Sig: TAKE 1 TABLET BY MOUTH DAILY     Cardiovascular:  ACEI + Diuretic Combos Failed - 04/25/2023  8:33 AM      Failed - Valid encounter within last 6 months    Recent Outpatient Visits           1 month ago Encounter for examination required by Department of Transportation (DOT)   [place] [facility] [place], Sanjuan Crumbly T, NP   1 month ago Spondylosis without myelopathy or radiculopathy, lumbar region   [facility] Aileen Alexanders, NP       Future Appointments             In 2 weeks Lemar Pyles, NP [place] [facility]            Passed - Na in normal range and within 180 days    Sodium  Date Value Ref Range Status  04/03/2023 135 134 - 144 mmol/L Final         Passed - K in normal range and within 180 days    Potassium  Date Value Ref Range Status  04/03/2023 4.3 3.5 - 5.2 mmol/L Final         Passed - Cr in normal range and within 180 days    Creat  Date Value Ref Range Status  02/19/2020 1.10 0.70 - 1.33 mg/dL Final    Comment:    For patients >59 years of age, the reference limit for Creatinine is approximately 13% higher for people identified as African-American. .    Creatinine, Ser  Date Value Ref Range Status  04/03/2023 0.92 0.76 - 1.27 mg/dL Final         Passed - eGFR is 30 or above and within 180 days    GFR, Est African American  Date Value Ref Range Status  02/19/2020 90 > OR = 60 mL/min/1.2m2 Final   GFR, Est Non African American  Date Value Ref Range Status  02/19/2020 77 > OR = 60 mL/min/1.61m2 Final   eGFR  Date Value Ref Range Status  04/03/2023 99 >59 mL/min/1.73 Final          Passed - Patient is not pregnant      Passed - Last BP in normal range    BP Readings from Last 1 Encounters:  04/17/23 127/79

## 2023-05-09 ENCOUNTER — Ambulatory Visit: Payer: Self-pay | Admitting: Nurse Practitioner

## 2023-05-11 ENCOUNTER — Ambulatory Visit: Admitting: Pain Medicine

## 2023-05-17 ENCOUNTER — Ambulatory Visit: Admitting: Nurse Practitioner

## 2023-05-17 DIAGNOSIS — R7301 Impaired fasting glucose: Secondary | ICD-10-CM

## 2023-05-17 DIAGNOSIS — M47816 Spondylosis without myelopathy or radiculopathy, lumbar region: Secondary | ICD-10-CM

## 2023-05-17 DIAGNOSIS — E782 Mixed hyperlipidemia: Secondary | ICD-10-CM

## 2023-05-17 DIAGNOSIS — G894 Chronic pain syndrome: Secondary | ICD-10-CM

## 2023-05-17 DIAGNOSIS — I10 Essential (primary) hypertension: Secondary | ICD-10-CM

## 2023-05-17 DIAGNOSIS — N400 Enlarged prostate without lower urinary tract symptoms: Secondary | ICD-10-CM

## 2023-05-25 ENCOUNTER — Ambulatory Visit: Admitting: Pain Medicine

## 2023-06-14 ENCOUNTER — Other Ambulatory Visit: Payer: Self-pay | Admitting: Nurse Practitioner

## 2023-06-15 NOTE — Telephone Encounter (Signed)
 Requested medications are due for refill today.  yes  Requested medications are on the active medications list.  yes  Last refill. 03/06/2023 #30 2 rf  Future visit scheduled.   no  Notes to clinic.  Labs are expired.    Requested Prescriptions  Pending Prescriptions Disp Refills   meloxicam  (MOBIC ) 15 MG tablet [Pharmacy Med Name: MELOXICAM  15MG  TABLETS] 30 tablet 2    Sig: TAKE 1 TABLET(15 MG) BY MOUTH DAILY     Analgesics:  COX2 Inhibitors Failed - 06/15/2023  4:27 PM      Failed - Manual Review: Labs are only required if the patient has taken medication for more than 8 weeks.      Failed - HGB in normal range and within 360 days    Hemoglobin  Date Value Ref Range Status  10/05/2021 15.2 13.0 - 17.7 g/dL Final         Failed - HCT in normal range and within 360 days    Hematocrit  Date Value Ref Range Status  10/05/2021 44.4 37.5 - 51.0 % Final         Failed - Valid encounter within last 12 months    Recent Outpatient Visits           2 months ago Encounter for examination required by Department of Transportation (DOT)   [facility] [place], Hardin T, NP   3 months ago Spondylosis without myelopathy or radiculopathy, lumbar region   [facility] Aileen Alexanders, NP              Passed - Cr in normal range and within 360 days    Creat  Date Value Ref Range Status  02/19/2020 1.10 0.70 - 1.33 mg/dL Final    Comment:    For patients >12 years of age, the reference limit for Creatinine is approximately 13% higher for people identified as African-American. .    Creatinine, Ser  Date Value Ref Range Status  04/03/2023 0.92 0.76 - 1.27 mg/dL Final         Passed - AST in normal range and within 360 days    AST  Date Value Ref Range Status  04/03/2023 24 0 - 40 IU/L Final         Passed - ALT in normal range and within 360 days    ALT  Date Value Ref Range Status  11/08/2022 29 0 - 44 IU/L Final          Passed - eGFR is 30 or above and within 360 days    GFR, Est African American  Date Value Ref Range Status  02/19/2020 90 > OR = 60 mL/min/1.53m2 Final   GFR, Est Non African American  Date Value Ref Range Status  02/19/2020 77 > OR = 60 mL/min/1.23m2 Final   eGFR  Date Value Ref Range Status  04/03/2023 99 >59 mL/min/1.73 Final         Passed - Patient is not pregnant

## 2023-06-18 NOTE — Patient Instructions (Incomplete)

## 2023-06-19 ENCOUNTER — Other Ambulatory Visit: Payer: Self-pay | Admitting: Nurse Practitioner

## 2023-06-19 NOTE — Telephone Encounter (Unsigned)
 Copied from CRM [id number]. Topic: Clinical - Medication Refill >> Jun 19, 2023  8:52 AM Carlatta H wrote: Medication: meloxicam  (MOBIC ) 15 MG tablet [[id number]]  Has the patient contacted their pharmacy? No (Agent: If no, request that the patient contact the pharmacy for the refill. If patient does not wish to contact the pharmacy document the reason why and proceed with request.) (Agent: If yes, when and what did the pharmacy advise?)  This is the patient's preferred pharmacy:  [id number] [place], [place] - [address] AT [facility] OF [address] [place] [address] Phone: [phone] Fax: [phone]    Is this the correct pharmacy for this prescription? Yes If no, delete pharmacy and type the correct one.   Has the prescription been filled recently? No  Is the patient out of the medication? Yes  Has the patient been seen for an appointment in the last year OR does the patient have an upcoming appointment? No  Can we respond through MyChart? Yes  Agent: Please be advised that Rx refills may take up to 3 business days. We ask that you follow-up with your pharmacy.

## 2023-06-20 ENCOUNTER — Ambulatory Visit: Admitting: Nurse Practitioner

## 2023-06-20 DIAGNOSIS — I10 Essential (primary) hypertension: Secondary | ICD-10-CM

## 2023-06-20 DIAGNOSIS — G894 Chronic pain syndrome: Secondary | ICD-10-CM

## 2023-06-20 DIAGNOSIS — N400 Enlarged prostate without lower urinary tract symptoms: Secondary | ICD-10-CM

## 2023-06-20 DIAGNOSIS — E782 Mixed hyperlipidemia: Secondary | ICD-10-CM

## 2023-06-20 NOTE — Telephone Encounter (Signed)
 Requested Prescriptions  Refused Prescriptions Disp Refills   meloxicam  (MOBIC ) 15 MG tablet 30 tablet 2     Analgesics:  COX2 Inhibitors Failed - 06/20/2023 10:40 AM      Failed - Manual Review: Labs are only required if the patient has taken medication for more than 8 weeks.      Failed - HGB in normal range and within 360 days    Hemoglobin  Date Value Ref Range Status  10/05/2021 15.2 13.0 - 17.7 g/dL Final         Failed - HCT in normal range and within 360 days    Hematocrit  Date Value Ref Range Status  10/05/2021 44.4 37.5 - 51.0 % Final         Failed - Valid encounter within last 12 months    Recent Outpatient Visits           3 months ago Encounter for examination required by Department of Transportation (DOT)   [facility] Bogard, Cleone T, NP   3 months ago Spondylosis without myelopathy or radiculopathy, lumbar region   [facility] Alexanders, NP              Passed - Cr in normal range and within 360 days    Creat  Date Value Ref Range Status  02/19/2020 1.10 0.70 - 1.33 mg/dL Final    Comment:    For patients >69 years of age, the reference limit for Creatinine is approximately 13% higher for people identified as African-American. .    Creatinine, Ser  Date Value Ref Range Status  04/03/2023 0.92 0.76 - 1.27 mg/dL Final         Passed - AST in normal range and within 360 days    AST  Date Value Ref Range Status  04/03/2023 24 0 - 40 IU/L Final         Passed - ALT in normal range and within 360 days    ALT  Date Value Ref Range Status  11/08/2022 29 0 - 44 IU/L Final         Passed - eGFR is 30 or above and within 360 days    GFR, Est African American  Date Value Ref Range Status  02/19/2020 90 > OR = 60 mL/min/1.35m2 Final   GFR, Est Non African American  Date Value Ref Range Status  02/19/2020 77 > OR = 60 mL/min/1.31m2 Final   eGFR  Date Value Ref Range Status   04/03/2023 99 >59 mL/min/1.73 Final         Passed - Patient is not pregnant

## 2023-06-27 ENCOUNTER — Ambulatory Visit: Admitting: Pain Medicine

## 2023-07-04 ENCOUNTER — Ambulatory Visit
Admission: RE | Admit: 2023-07-04 | Discharge: 2023-07-04 | Disposition: A | Discharge status: Home / Self Care | Source: Ambulatory Visit | Attending: Pain Medicine | Admitting: Pain Medicine

## 2023-07-04 ENCOUNTER — Ambulatory Visit: Admitting: Pain Medicine

## 2023-07-04 ENCOUNTER — Encounter: Payer: Self-pay | Admitting: Pain Medicine

## 2023-07-04 VITALS — BP 145/95 | Temp 98.4°F | Resp 21 | Ht 71.0 in | Wt 278.0 lb

## 2023-07-04 DIAGNOSIS — M51369 Other intervertebral disc degeneration, lumbar region without mention of lumbar back pain or lower extremity pain: Secondary | ICD-10-CM | POA: Insufficient documentation

## 2023-07-04 DIAGNOSIS — M545 Low back pain, unspecified: Secondary | ICD-10-CM

## 2023-07-04 DIAGNOSIS — M5459 Other low back pain: Secondary | ICD-10-CM | POA: Diagnosis present

## 2023-07-04 DIAGNOSIS — G8929 Other chronic pain: Secondary | ICD-10-CM | POA: Insufficient documentation

## 2023-07-04 DIAGNOSIS — R937 Abnormal findings on diagnostic imaging of other parts of musculoskeletal system: Secondary | ICD-10-CM | POA: Insufficient documentation

## 2023-07-04 DIAGNOSIS — M47816 Spondylosis without myelopathy or radiculopathy, lumbar region: Secondary | ICD-10-CM | POA: Insufficient documentation

## 2023-07-04 MED ORDER — PENTAFLUOROPROP-TETRAFLUOROETH EX AERO: INHALATION_SPRAY | Freq: Once | CUTANEOUS | Status: DC

## 2023-07-04 MED ORDER — MIDAZOLAM HCL 5 MG/5ML IJ SOLN: 0.5000 mg | Freq: Once | INTRAMUSCULAR | Status: DC

## 2023-07-04 MED ORDER — ROPIVACAINE HCL 2 MG/ML IJ SOLN
18.0000 mL | Freq: Once | INTRAMUSCULAR | Status: AC
  Administered 2023-07-04: 18 mL via PERINEURAL
  Filled 2023-07-04: qty 20

## 2023-07-04 MED ORDER — LIDOCAINE HCL 2 % IJ SOLN
20.0000 mL | Freq: Once | INTRAMUSCULAR | Status: AC
  Administered 2023-07-04: 400 mg
  Filled 2023-07-04: qty 20

## 2023-07-04 MED ORDER — TRIAMCINOLONE ACETONIDE 40 MG/ML IJ SUSP
80.0000 mg | Freq: Once | INTRAMUSCULAR | Status: AC
  Administered 2023-07-04: 80 mg
  Filled 2023-07-04: qty 2

## 2023-07-04 MED ORDER — FENTANYL CITRATE (PF) 100 MCG/2ML IJ SOLN: 25.0000 ug | INTRAMUSCULAR | Status: DC | PRN

## 2023-07-04 NOTE — Progress Notes (Signed)
 PROVIDER NOTE: Interpretation of information contained herein should be left to medically-trained personnel. Specific patient instructions are provided elsewhere under Patient Instructions section of medical record. This document was created in part using STT-dictation technology, any transcriptional errors that may result from this process are unintentional.  Patient: Paul [name] Type: Established DOB: 07/07/1970 MRN: [id number] PCP: Valerio Melanie DASEN, NP  Service: Procedure DOS: 07/04/2023 Setting: Ambulatory Location: Ambulatory outpatient facility Delivery: Face-to-face Provider: Eric DELENA Como, MD Specialty: Interventional Pain Management Specialty designation: 09 Location: Outpatient facility Ref. Prov.: Como Eric, MD       Interventional Therapy   Type: Lumbar Facet, Medial Branch Block(s) (w/ fluoroscopic mapping) #1  Laterality: Bilateral  Level: L3, L4, L5, and S1 Medial Branch/Dorsal Rami Level(s). Injecting these levels blocks the L4-5 and L5-S1 lumbar facet joints.  Imaging: Fluoroscopic guidance Spinal ([id number]) Anesthesia: Local anesthesia (1-2% Lidocaine) Anxiolysis: None                 Sedation: No Sedation                       DOS: 07/04/2023 Performed by: Eric DELENA Como, MD  Primary Purpose: Diagnostic/Therapeutic Indications: Low back pain severe enough to impact quality of life or function. 1. Chronic low back pain (1ry area of Pain) (Bilateral) (R>L) w/o sciatica   2. Low back pain of over 3 months duration   3. Lumbar facet joint pain (Bilateral) (R>L)   4. Lumbar facet joint arthropathy   5. Spondylosis without myelopathy or radiculopathy, lumbar region    NAS-11 Pain score:   Pre-procedure: 5 /10   Post-procedure: 0-No pain/10     Position / Prep / Materials:  Position: Prone  Prep solution: ChloraPrep (2% chlorhexidine gluconate and 70% isopropyl alcohol) Area Prepped: Posterolateral Lumbosacral Spine (Wide prep: From the  lower border of the scapula down to the end of the tailbone and from flank to flank.)  Materials:  Tray: Block Needle(s):  Type: Spinal  Gauge (G): 22  Length: 5-in Qty: 4     H&P (Pre-op Assessment):  Paul [name] is a 53 y.o. (year old), male patient, seen today for interventional treatment. He  has a past surgical history that includes Appendectomy and Hernia repair. Paul [name] has a current medication list which includes the following prescription(s): vitamin d3, cholecalciferol, ergocalciferol , lisinopril -hydrochlorothiazide , meloxicam , multiple vitamin, and tadalafil , and the following Facility-Administered Medications: pentafluoroprop-tetrafluoroeth. His primarily concern today is the Back Pain (low)  Initial Vital Signs:  Pulse/HCG Rate:  ECG Heart Rate: 69 Temp: 98.4 F (36.9 C) Resp: 17 BP: (!) 142/96 SpO2: 99 %  BMI: Estimated body mass index is 38.77 kg/m as calculated from the following:   Height as of this encounter: 5' 11 (1.803 m).   Weight as of this encounter: 278 lb (126.1 kg).  Risk Assessment: Allergies: Reviewed. He has no known allergies.  Allergy Precautions: None required Coagulopathies: Reviewed. None identified.  Blood-thinner therapy: None at this time Active Infection(s): Reviewed. None identified. Paul [name] is afebrile  Site Confirmation: Paul [name] was asked to confirm the procedure and laterality before marking the site Procedure checklist: Completed Consent: Before the procedure and under the influence of no sedative(s), amnesic(s), or anxiolytics, the patient was informed of the treatment options, risks and possible complications. To fulfill our ethical and legal obligations, as recommended by the American Medical Association's Code of Ethics, I have informed the patient of my clinical impression; the nature and purpose of the treatment  or procedure; the risks, benefits, and possible complications of the intervention; the alternatives,  including doing nothing; the risk(s) and benefit(s) of the alternative treatment(s) or procedure(s); and the risk(s) and benefit(s) of doing nothing. The patient was provided information about the general risks and possible complications associated with the procedure. These may include, but are not limited to: failure to achieve desired goals, infection, bleeding, organ or nerve damage, allergic reactions, paralysis, and death. In addition, the patient was informed of those risks and complications associated to Spine-related procedures, such as failure to decrease pain; infection (i.e.: Meningitis, epidural or intraspinal abscess); bleeding (i.e.: epidural hematoma, subarachnoid hemorrhage, or any other type of intraspinal or peri-dural bleeding); organ or nerve damage (i.e.: Any type of peripheral nerve, nerve root, or spinal cord injury) with subsequent damage to sensory, motor, and/or autonomic systems, resulting in permanent pain, numbness, and/or weakness of one or several areas of the body; allergic reactions; (i.e.: anaphylactic reaction); and/or death. Furthermore, the patient was informed of those risks and complications associated with the medications. These include, but are not limited to: allergic reactions (i.e.: anaphylactic or anaphylactoid reaction(s)); adrenal axis suppression; blood sugar elevation that in diabetics may result in ketoacidosis or comma; water retention that in patients with history of congestive heart failure may result in shortness of breath, pulmonary edema, and decompensation with resultant heart failure; weight gain; swelling or edema; medication-induced neural toxicity; particulate matter embolism and blood vessel occlusion with resultant organ, and/or nervous system infarction; and/or aseptic necrosis of one or more joints. Finally, the patient was informed that Medicine is not an exact science; therefore, there is also the possibility of unforeseen or unpredictable risks  and/or possible complications that may result in a catastrophic outcome. The patient indicated having understood very clearly. We have given the patient no guarantees and we have made no promises. Enough time was given to the patient to ask questions, all of which were answered to the patient's satisfaction. Paul [name] has indicated that he wanted to continue with the procedure. Attestation: I, the ordering provider, attest that I have discussed with the patient the benefits, risks, side-effects, alternatives, likelihood of achieving goals, and potential problems during recovery for the procedure that I have provided informed consent. Date  Time: 07/04/2023  9:46 AM  Pre-Procedure Preparation:  Monitoring: As per clinic protocol. Respiration, ETCO2, SpO2, BP, heart rate and rhythm monitor placed and checked for adequate function Safety Precautions: Patient was assessed for positional comfort and pressure points before starting the procedure. Time-out: I initiated and conducted the Time-out before starting the procedure, as per protocol. The patient was asked to participate by confirming the accuracy of the Time Out information. Verification of the correct person, site, and procedure were performed and confirmed by me, the nursing staff, and the patient. Time-out conducted as per Joint Commission's Universal Protocol (UP.01.01.01). Time: 1009 Start Time: 1009 hrs.  Description of Procedure:          Laterality: (see above) Targeted Levels: (see above)  Safety Precautions: Aspiration looking for blood return was conducted prior to all injections. At no point did we inject any substances, as a needle was being advanced. Before injecting, the patient was told to immediately notify me if he was experiencing any new onset of ringing in the ears, or metallic taste in the mouth. No attempts were made at seeking any paresthesias. Safe injection practices and needle disposal techniques used.  Medications properly checked for expiration dates. SDV (single dose vial) medications used. After  the completion of the procedure, all disposable equipment used was discarded in the proper designated medical waste containers. Local Anesthesia: Protocol guidelines were followed. The patient was positioned over the fluoroscopy table. The area was prepped in the usual manner. The time-out was completed. The target area was identified using fluoroscopy. A 12-in long, straight, sterile hemostat was used with fluoroscopic guidance to locate the targets for each level blocked. Once located, the skin was marked with an approved surgical skin marker. Once all sites were marked, the skin (epidermis, dermis, and hypodermis), as well as deeper tissues (fat, connective tissue and muscle) were infiltrated with a small amount of a short-acting local anesthetic, loaded on a 10cc syringe with a 25G, 1.5-in  Needle. An appropriate amount of time was allowed for local anesthetics to take effect before proceeding to the next step. Local Anesthetic: Lidocaine 2.0% The unused portion of the local anesthetic was discarded in the proper designated containers. Technical description of process:  Medial Branch  Dorsal Rami Nerve Block (MBB):  Neuroanatomy note: Each lumbar facet joint receives dual innervation from medial branches arising from the posterior primary rami at the same level and one level above. The target for each lumbar medial branch is the junction of the ipsilateral superior articular and transverse process of the lower vertebral body. (i.e.: The L4-L5 facet joint is innervated by the L4 medial branch [located at L5] and the L3 medial branch [located at L4]. Blocking the L4 Medial Branch is therefore achieved by injecting at the junction of the ipsilateral superior articular and transverse process of the lower vertebral body [L5].).  Exception: The exception to the above rule is the L5-S1 facet joint which has triple  innervation requiring the L4 medial branch, as well as the L5 and the S1 Dorsal Rami(s) to be blocked to fully denervate the joint.  Under fluoroscopic guidance, a needle was inserted until contact was made with os over the target area. After negative aspiration, 0.5 mL of the nerve block solution was injected without difficulty or complication. Paresthesia were avoided during injection. The needle(s) were removed intact and without complication.  Once the entire procedure was completed, the treated area was cleaned, making sure to leave some of the prepping solution back to take advantage of its long term bactericidal properties.         Illustration of the posterior view of the lumbar spine and the posterior neural structures. Laminae of L2 through S1 are labeled. DPRL5, dorsal primary ramus of L5; DPRS1, dorsal primary ramus of S1; DPR3, dorsal primary ramus of L3; FJ, facet (zygapophyseal) joint L3-L4; I, inferior articular process of L4; LB1, lateral branch of dorsal primary ramus of L1; IAB, inferior articular branches from L3 medial branch (supplies L4-L5 facet joint); IBP, intermediate branch plexus; MB3, medial branch of dorsal primary ramus of L3; NR3, third lumbar nerve root; S, superior articular process of L5; SAB, superior articular branches from L4 (supplies L4-5 facet joint also); TP3, transverse process of L3.   Facet Joint Innervation (* possible contribution)  L1-2 T12, L1 (L2*)  Medial Branch  L2-3 L1, L2 (L3*)                     L3-4 L2, L3 (L4*)                     L4-5 L3, L4 (L5*)  L5-S1 L4, L5, S1                        Vitals:   07/04/23 1009 07/04/23 1015 07/04/23 1019 07/04/23 1023  BP: (!) 142/96 (!) 134/98 (!) 145/95 (!) (P) 144/94  Resp: 17 14 18  (!) 21  Temp:      SpO2: 99% 100% 98% 96%  Weight:      Height:         End Time: 1022 hrs.  Imaging Guidance (Spinal):         Type of Imaging Technique: Fluoroscopy Guidance  (Spinal) Indication(s): Fluoroscopy guidance for needle placement to enhance accuracy in procedures requiring precise needle localization for targeted delivery of medication in or near specific anatomical locations not easily accessible without such real-time imaging assistance. Exposure Time: Please see nurses notes. Contrast: None used. Fluoroscopic Guidance: I was personally present during the use of fluoroscopy. Tunnel Vision Technique used to obtain the best possible view of the target area. Parallax error corrected before commencing the procedure. Direction-depth-direction technique used to introduce the needle under continuous pulsed fluoroscopy. Once target was reached, antero-posterior, oblique, and lateral fluoroscopic projection used confirm needle placement in all planes. Images permanently stored in EMR. Interpretation: No contrast injected. I personally interpreted the imaging intraoperatively. Adequate needle placement confirmed in multiple planes. Permanent images saved into the patient's record.  Post-operative Assessment:  Post-procedure Vital Signs:  Pulse/HCG Rate:  73 Temp: 98.4 F (36.9 C) Resp: (!) 21 BP: (!) (P) 144/94 SpO2: 96 %  EBL: None  Complications: No immediate post-treatment complications observed by team, or reported by patient.  Note: The patient tolerated the entire procedure well. A repeat set of vitals were taken after the procedure and the patient was kept under observation following institutional policy, for this type of procedure. Post-procedural neurological assessment was performed, showing return to baseline, prior to discharge. The patient was provided with post-procedure discharge instructions, including a section on how to identify potential problems. Should any problems arise concerning this procedure, the patient was given instructions to immediately contact us , at any time, without hesitation. In any case, we plan to contact the patient by  telephone for a follow-up status report regarding this interventional procedure.  Comments:  No additional relevant information.  Plan of Care (POC)  Orders:  Orders Placed This Encounter  Procedures   LUMBAR FACET(MEDIAL BRANCH NERVE BLOCK) MBNB    Scheduling Instructions:     Procedure: Lumbar facet block (AKA.: Lumbosacral medial branch nerve block)     Side: Bilateral     Level: L3-4, L4-5, L5-S1, and TBD Facets (L2, L3, L4, L5, S1, and TBD Medial Branch Nerves)     Sedation: Patient's choice.     Date: 07/04/2023    Where will this procedure be performed?:   [facility]   DG [facility] C-ARM 1-60 MIN NO REPORT    Intraoperative interpretation by procedural physician at [facility].    Standing Status:   Standing    Number of Occurrences:   1    Reason for exam::   Assistance in needle guidance and placement for procedures requiring needle placement in or near specific anatomical locations not easily accessible without such assistance.   Informed Consent Details: Physician/Practitioner Attestation; Transcribe to consent form and obtain patient signature    Nursing Order: Transcribe to consent form and obtain patient signature. Note: Always confirm laterality of pain with Paul [name], before procedure.  Physician/Practitioner attestation of informed consent for procedure/surgical case:   I, the physician/practitioner, attest that I have discussed with the patient the benefits, risks, side effects, alternatives, likelihood of achieving goals and potential problems during recovery for the procedure that I have provided informed consent.    Procedure:   Lumbar Facet Block  under fluoroscopic guidance    Physician/Practitioner performing the procedure:   Ramandeep Arington A. Tanya MD    Indication/Reason:   Low Back Pain, with our without leg pain, due to Facet Joint Arthralgia (Joint Pain) Spondylosis (Arthritis of the Spine), without myelopathy or radiculopathy (Nerve  Damage).   Provide equipment / supplies at bedside    Procedure tray: Block Tray (Disposable  single use) Skin infiltration needle: Regular 1.5-in, 25-G, (x1) Block Needle type: Spinal Amount/quantity: 4 Size: Medium (5-inch) Gauge: 22G    Standing Status:   Standing    Number of Occurrences:   1    Specify:   Block Tray    Opioid Analgesic(s):  No chronic opioid analgesics therapy prescribed by our practice. None. MME/day: 0 mg/day    Medications ordered for procedure: Meds ordered this encounter  Medications   lidocaine (XYLOCAINE) 2 % (with pres) injection 400 mg   pentafluoroprop-tetrafluoroeth (GEBAUERS) aerosol   DISCONTD: midazolam (VERSED) 5 MG/5ML injection 0.5-2 mg    Make sure Flumazenil is available in the pyxis when using this medication. If oversedation occurs, administer 0.2 mg IV over 15 sec. If after 45 sec no response, administer 0.2 mg again over 1 min; may repeat at 1 min intervals; not to exceed 4 doses (1 mg)   DISCONTD: fentaNYL (SUBLIMAZE) injection 25-50 mcg    Make sure Narcan is available in the pyxis when using this medication. In the event of respiratory depression (RR< 8/min): Titrate NARCAN (naloxone) in increments of 0.1 to 0.2 mg IV at 2-3 minute intervals, until desired degree of reversal.   ropivacaine (PF) 2 mg/mL (0.2%) (NAROPIN) injection 18 mL   triamcinolone acetonide (KENALOG-40) injection 80 mg   Medications administered: We administered lidocaine, ropivacaine (PF) 2 mg/mL (0.2%), and triamcinolone acetonide.  See the medical record for exact dosing, route, and time of administration.    Interventional Therapies  Risk Factors  Considerations  Medical Comorbidities:     Planned  Pending:      Under consideration:   Pending   Completed:   None at this time   Therapeutic  Palliative (PRN) options:   None established   Completed by other providers:   None reported      Follow-up plan:   Return in about 2 weeks  (around 07/18/2023) for (PPE), (VV).     Recent Visits Date Type Provider Dept  04/17/23 Office Visit Tanya Glisson, MD [facility]  Showing recent visits within past 90 days and meeting all other requirements Today's Visits Date Type Provider Dept  07/04/23 Procedure visit Tanya Glisson, MD [facility]  Showing today's visits and meeting all other requirements Future Appointments Date Type Provider Dept  07/18/23 Appointment Tanya Glisson, MD [facility]  Showing future appointments within next 90 days and meeting all other requirements   Disposition: Discharge home  Discharge (Date  Time): 07/04/2023; 1024 hrs.   Primary Care Physician: Cannady, Jolene T, NP Location: [facility] Note by: Glisson DELENA Tanya, MD (TTS technology used. I apologize for any typographical errors that were not detected and corrected.) Date: 07/04/2023; Time: 10:47 AM  Disclaimer:  Medicine is not an  exact science. The only guarantee in medicine is that nothing is guaranteed. It is important to note that the decision to proceed with this intervention was based on the information collected from the patient. The Data and conclusions were drawn from the patient's questionnaire, the interview, and the physical examination. Because the information was provided in large part by the patient, it cannot be guaranteed that it has not been purposely or unconsciously manipulated. Every effort has been made to obtain as much relevant data as possible for this evaluation. It is important to note that the conclusions that lead to this procedure are derived in large part from the available data. Always take into account that the treatment will also be dependent on availability of resources and existing treatment guidelines, considered by other Pain Management Practitioners as being common knowledge and practice, at the time of the intervention. For  Medico-Legal purposes, it is also important to point out that variation in procedural techniques and pharmacological choices are the acceptable norm. The indications, contraindications, technique, and results of the above procedure should only be interpreted and judged by a Board-Certified Interventional Pain Specialist with extensive familiarity and expertise in the same exact procedure and technique.

## 2023-07-04 NOTE — Patient Instructions (Signed)

## 2023-07-04 NOTE — Progress Notes (Signed)
 Safety precautions to be maintained throughout the outpatient stay will include: orient to surroundings, keep bed in low position, maintain call bell within reach at all times, provide assistance with transfer out of bed and ambulation.

## 2023-07-05 ENCOUNTER — Telehealth: Payer: Self-pay | Admitting: *Deleted

## 2023-07-05 NOTE — Telephone Encounter (Signed)
 Attempted to call for post procedure follow-up. No answer, mailbox full.

## 2023-07-18 ENCOUNTER — Telehealth: Payer: Self-pay

## 2023-07-18 ENCOUNTER — Ambulatory Visit: Payer: Self-pay | Attending: Pain Medicine | Admitting: Pain Medicine

## 2023-07-18 DIAGNOSIS — M545 Low back pain, unspecified: Secondary | ICD-10-CM

## 2023-07-18 DIAGNOSIS — Z09 Encounter for follow-up examination after completed treatment for conditions other than malignant neoplasm: Secondary | ICD-10-CM

## 2023-07-18 DIAGNOSIS — M5459 Other low back pain: Secondary | ICD-10-CM

## 2023-07-18 NOTE — Telephone Encounter (Signed)
 Attempt to contact for nursing portion of his telephone visit with Dr. Kennard, no answer and LVM to return call.

## 2023-07-18 NOTE — Progress Notes (Signed)
 PROVIDER NOTE: Interpretation of information contained herein should be left to medically-trained personnel. Specific patient instructions are provided elsewhere under Patient Instructions section of medical record. This document was created in part using AI and STT-dictation technology, any transcriptional errors that may result from this process are unintentional.  Patient: Paul [name]  Service: E/M   PCP: Paul Melanie DASEN, NP  DOB: September 22, 1970  DOS: 07/18/2023  Provider: Eric DELENA Como, MD  MRN: [id number]  Delivery: Virtual Visit  Specialty: Interventional Pain Management  Type: Established Patient  Setting: Ambulatory outpatient facility  Specialty designation: 09  Referring Prov.: Paul Melanie DASEN, NP  Location: Remote location       Virtual Encounter - Pain Management PROVIDER NOTE: Information contained herein reflects review and annotations entered in association with encounter. Interpretation of such information and data should be left to medically-trained personnel. Information provided to patient can be located elsewhere in the medical record under Patient Instructions. Document created using STT-dictation technology, any transcriptional errors that may result from process are unintentional.    Contact & Pharmacy Preferred: [phone] Home: [phone] (home) Mobile: [phone] (mobile) E-mail: [email or web address] [name] [place], [place] - [address] AT [facility] OF [address] [place] [address] Phone: [phone] Fax: [phone]  [facility] [address] [place] - [address] [place] [address] Phone: [phone] Fax: [phone]   Pre-screening  Paul [name] offered in-person vs virtual encounter. He indicated preferring virtual for this encounter.   Reason COVID-19*  Social distancing based on CDC and AMA recommendations.   I contacted Paul [name] on  07/18/2023 via telephone.      I clearly identified myself as Paul DELENA Como, MD. I verified that I was speaking with the correct person using two identifiers (Name: Paul [name], and date of birth: 1970-12-29).  Consent I sought verbal advanced consent from Paul [name] for virtual visit interactions. I informed Paul [name] of possible security and privacy concerns, risks, and limitations associated with providing not-in-person medical evaluation and management services. I also informed Paul [name] of the availability of in-person appointments. Finally, I informed him that there would be a charge for the virtual visit and that he could be  personally, fully or partially, financially responsible for it. Paul [name] expressed understanding and agreed to proceed.   Historic Elements   Paul [name] is a 53 y.o. year old, male patient evaluated today after our last contact on 07/04/2023. Paul [name]  has a past medical history of Hypertension, Primary hypertension (12/11/2020), and Vitamin D  deficiency. He also  has a past surgical history that includes Appendectomy and Hernia repair. Paul [name] has a current medication list which includes the following prescription(s): cholecalciferol, lisinopril -hydrochlorothiazide , meloxicam , multiple vitamin, and tadalafil . He  reports that he has never smoked. He has never been exposed to tobacco smoke. He has never used smokeless tobacco. He reports current alcohol use of about 18.0 standard drinks of alcohol per week. He reports that he does not use drugs. Paul [name] has no known allergies.  BMI: Estimated body mass index is 38.77 kg/m as calculated from the following:   Height as of 07/04/23: 5' 11 (1.803 m).   Weight as of 07/04/23: 278 lb (126.1 kg). Last encounter: 04/17/2023. Last procedure: 07/04/2023.  HPI  Today, he is being contacted for a post-procedure assessment.  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  He was instructed to give us  a call when the pain returns so as to plan on repeating this diagnostic injection.  He understood and accepted.  Post-Procedure Evaluation   Type: Lumbar Facet, Medial Branch Block(s) (w/ fluoroscopic mapping) #1  Laterality: Bilateral  Level: L3, L4, L5, and S1 Medial Branch/Dorsal Rami Level(s). Injecting these levels blocks the L4-5 and L5-S1 lumbar facet joints.  Imaging: Fluoroscopic guidance Spinal ([id number]) Anesthesia: Local anesthesia (1-2% Lidocaine ) Anxiolysis: None                 Sedation: No Sedation                       DOS: 07/04/2023 Performed by: Paul DELENA Como, MD  Primary Purpose: Diagnostic/Therapeutic Indications: Low back pain severe enough to impact quality of life or function. 1. Chronic low back pain (1ry area of Pain) (Bilateral) (R>L) w/o sciatica   2. Low back pain of over 3 months duration   3. Lumbar facet joint pain (Bilateral) (R>L)   4. Lumbar facet joint arthropathy   5. Spondylosis without myelopathy or radiculopathy, lumbar region    NAS-11 Pain score:   Pre-procedure: 5 /10   Post-procedure: 0-No pain/10     Effectiveness:  Initial hour after procedure: 100 %. Subsequent 4-6 hours post-procedure: 100 %. Analgesia past initial 6 hours: 80 %. Ongoing improvement:  Analgesic: The patient indicates having attained a 100% relief of the pain for the duration of local anesthetic followed by an ongoing 80% improvement of his pain.  He refers being extremely happy with the results. Function: Paul [name] reports improvement in function ROM: Paul [name] reports improvement in ROM Interpretation: The results of this diagnostic procedure confirm the patient to be having low back pain associated with facet joint arthropathy.  The plan is to observe the duration of benefit and to repeat the diagnostic injection once the pain returns.  If the procedure ends up having  to be repeated within the next 3 to 6 months, we will consider radiofrequency ablation as an alternative to extending these benefits.  However, we have reminded the patient that he needs to work on bringing his weight down.  Pharmacotherapy Assessment  Opioid Analgesic(s): No chronic opioid analgesics therapy prescribed by our practice. None. MME/day: 0 mg/day   Monitoring: [place] PMP: PDMP reviewed during this encounter.       Pharmacotherapy: No side-effects or adverse reactions reported. Compliance: No problems identified. Effectiveness: Clinically acceptable. Plan: Refer to POC.  UDS:  Summary  Date Value Ref Range Status  04/03/2023 FINAL  Final    Comment:    ==================================================================== Compliance Drug Analysis, Ur ==================================================================== Test                             Result       Flag       Units    NO DRUGS DETECTED. ==================================================================== Test                      Result    Flag   Units      Ref Range   Creatinine              83               mg/dL      >=79 ==================================================================== Declared Medications:  The flagging and interpretation on this report are  based on the  following declared medications.  Unexpected results may arise from  inaccuracies in the declared medications.   **Note: The testing scope of this panel does not include the  following reported medications:   Cholecalciferol  Hydrochlorothiazide  (Zestoretic )  Lisinopril  (Zestoretic )  Meloxicam  (Mobic )  Multivitamin  Tadalafil  (Cialis ) ==================================================================== For clinical consultation, please call [phone]. ====================================================================    No results found for: CBDTHCRKATHLYNE, D9THCCBX  Laboratory Chemistry Profile    Renal Lab Results  Component Value Date   BUN 17 04/03/2023   CREATININE 0.92 04/03/2023   BCR 18 04/03/2023   GFRAA 90 02/19/2020   GFRNONAA 77 02/19/2020    Hepatic Lab Results  Component Value Date   AST 24 04/03/2023   ALT 29 11/08/2022   ALBUMIN 4.6 04/03/2023   ALKPHOS 86 04/03/2023    Electrolytes Lab Results  Component Value Date   NA 135 04/03/2023   K 4.3 04/03/2023   CL 99 04/03/2023   CALCIUM 9.2 04/03/2023   MG 2.1 04/03/2023    Bone Lab Results  Component Value Date   VD25OH 27.2 (L) 05/03/2022   25OHVITD1 19 (L) 04/03/2023   25OHVITD2 <1.0 04/03/2023   25OHVITD3 19 04/03/2023   TESTOFREE 201.2 12/17/2019   TESTOSTERONE  873 (H) 12/17/2019    Inflammation (CRP: Acute Phase) (ESR: Chronic Phase) Lab Results  Component Value Date   CRP 1 04/03/2023   ESRSEDRATE 7 04/03/2023         Note: Above Lab results reviewed.  Imaging  DG PAIN CLINIC C-ARM 1-60 MIN NO REPORT Fluoro was used, but no Radiologist interpretation will be provided.  Please refer to NOTES tab for provider progress note.  Assessment  The primary encounter diagnosis was Chronic low back pain (1ry area of Pain) (Bilateral) (R>L) w/o sciatica. Diagnoses of Low back pain of over 3 months duration, Lumbar facet joint pain (Bilateral) (R>L), Mechanical low back pain, and Postop check were also pertinent to this visit.  Plan of Care  Problem-specific:  No problem-specific Assessment & Plan notes found for this encounter.  Mr. Fernado Brigante has a current medication list which includes the following long-term medication(s): lisinopril -hydrochlorothiazide  and tadalafil .  Pharmacotherapy (Medications Ordered): No orders of the defined types were placed in this encounter.  Orders:  Orders Placed This Encounter  Procedures   Nursing Instructions:    Please complete this patient's postprocedure evaluation.    Scheduling Instructions:     Please complete this patient's postprocedure  evaluation.   Follow-up plan:   Return if symptoms worsen or fail to improve.      Interventional Therapies  Risk Factors  Considerations  Medical Comorbidities:     Planned  Pending:      Under consideration:   Pending   Completed:   None at this time   Therapeutic  Palliative (PRN) options:   None established   Completed by other providers:   None reported      Recent Visits Date Type Provider Dept  07/04/23 Procedure visit Tanya Glisson, MD [facility]  Showing recent visits within past 90 days and meeting all other requirements Today's Visits Date Type Provider Dept  07/18/23 Office Visit Tanya Glisson, MD [facility]  Showing today's visits and meeting all other requirements Future Appointments No visits were found meeting these conditions. Showing future appointments within next 90 days and meeting all other requirements  I discussed the assessment and treatment plan with the patient. The patient was provided an opportunity to  ask questions and all were answered. The patient agreed with the plan and demonstrated an understanding of the instructions.  Patient advised to call back or seek an in-person evaluation if the symptoms or condition worsens.  Duration of encounter: 12 minutes.  Note by: Paul DELENA Como, MD Date: 07/18/2023; Time: 2:10 PM

## 2023-08-04 ENCOUNTER — Other Ambulatory Visit: Payer: Self-pay | Admitting: Nurse Practitioner

## 2023-08-04 NOTE — Telephone Encounter (Signed)
 Copied from CRM #[id number]. Topic: Clinical - Medication Refill >> Aug 04, 2023  9:53 AM Sophia H wrote: Medication: Lisinopril -hydrochlorothiazide  (ZESTORETIC ) 20-25 MG tablet meloxicam  (MOBIC ) 15 MG tablet    Has the patient contacted their pharmacy? Yes, told to contact office.  This is the patient's preferred pharmacy:  [id number] [place], [place] - [address] AT [facility] OF [address] [place] [address] Phone: [phone] Fax: [phone]   Is this the correct pharmacy for this prescription? Yes If no, delete pharmacy and type the correct one.   Has the prescription been filled recently? Yes  Is the patient out of the medication? Yes  Has the patient been seen for an appointment in the last year OR does the patient have an upcoming appointment? Yes, seen 03/21/23. Patient states he got a new job and is waiting for new insurance to kick in so he can schedule follow up with PCP.  Can we respond through MyChart? Yes  Agent: Please be advised that Rx refills may take up to 3 business days. We ask that you follow-up with your pharmacy.

## 2023-08-06 ENCOUNTER — Other Ambulatory Visit: Payer: Self-pay | Admitting: Nurse Practitioner

## 2023-08-07 MED ORDER — LISINOPRIL-HYDROCHLOROTHIAZIDE 20-25 MG PO TABS: 1.0000 | ORAL_TABLET | Freq: Every day | ORAL | 0 refills | Status: DC

## 2023-08-07 NOTE — Telephone Encounter (Signed)
 Requested Prescriptions  Pending Prescriptions Disp Refills   lisinopril -hydrochlorothiazide  (ZESTORETIC ) 20-25 MG tablet 90 tablet 0    Sig: Take 1 tablet by mouth daily.     Cardiovascular:  ACEI + Diuretic Combos Failed - 08/07/2023 10:06 AM      Failed - Last BP in normal range    BP Readings from Last 1 Encounters:  07/04/23 (!) (P) 144/94         Passed - Na in normal range and within 180 days    Sodium  Date Value Ref Range Status  04/03/2023 135 134 - 144 mmol/L Final         Passed - K in normal range and within 180 days    Potassium  Date Value Ref Range Status  04/03/2023 4.3 3.5 - 5.2 mmol/L Final         Passed - Cr in normal range and within 180 days    Creat  Date Value Ref Range Status  02/19/2020 1.10 0.70 - 1.33 mg/dL Final    Comment:    For patients >12 years of age, the reference limit for Creatinine is approximately 13% higher for people identified as African-American. .    Creatinine, Ser  Date Value Ref Range Status  04/03/2023 0.92 0.76 - 1.27 mg/dL Final         Passed - eGFR is 30 or above and within 180 days    GFR, Est African American  Date Value Ref Range Status  02/19/2020 90 > OR = 60 mL/min/1.37m2 Final   GFR, Est Non African American  Date Value Ref Range Status  02/19/2020 77 > OR = 60 mL/min/1.16m2 Final   eGFR  Date Value Ref Range Status  04/03/2023 99 >59 mL/min/1.73 Final         Passed - Patient is not pregnant      Passed - Valid encounter within last 6 months    Recent Outpatient Visits           4 months ago Encounter for examination required by Department of Transportation (DOT)   [place] [place], [place] T, NP   5 months ago Spondylosis without myelopathy or radiculopathy, lumbar region   [facility] Melvin Pao, NP              Refused Prescriptions Disp Refills   meloxicam  (MOBIC ) 15 MG tablet 30 tablet 2     Analgesics:  COX2 Inhibitors  Failed - 08/07/2023 10:06 AM      Failed - Manual Review: Labs are only required if the patient has taken medication for more than 8 weeks.      Failed - HGB in normal range and within 360 days    Hemoglobin  Date Value Ref Range Status  10/05/2021 15.2 13.0 - 17.7 g/dL Final         Failed - HCT in normal range and within 360 days    Hematocrit  Date Value Ref Range Status  10/05/2021 44.4 37.5 - 51.0 % Final         Passed - Cr in normal range and within 360 days    Creat  Date Value Ref Range Status  02/19/2020 1.10 0.70 - 1.33 mg/dL Final    Comment:    For patients >38 years of age, the reference limit for Creatinine is approximately 13% higher for people identified as African-American. .    Creatinine, Ser  Date Value Ref Range Status  04/03/2023  0.92 0.76 - 1.27 mg/dL Final         Passed - AST in normal range and within 360 days    AST  Date Value Ref Range Status  04/03/2023 24 0 - 40 IU/L Final         Passed - ALT in normal range and within 360 days    ALT  Date Value Ref Range Status  11/08/2022 29 0 - 44 IU/L Final         Passed - eGFR is 30 or above and within 360 days    GFR, Est African American  Date Value Ref Range Status  02/19/2020 90 > OR = 60 mL/min/1.45m2 Final   GFR, Est Non African American  Date Value Ref Range Status  02/19/2020 77 > OR = 60 mL/min/1.72m2 Final   eGFR  Date Value Ref Range Status  04/03/2023 99 >59 mL/min/1.73 Final         Passed - Patient is not pregnant      Passed - Valid encounter within last 12 months    Recent Outpatient Visits           4 months ago Encounter for examination required by Department of Transportation (DOT)    [facility] [place], [place] T, NP   5 months ago Spondylosis without myelopathy or radiculopathy, lumbar region   [facility] Melvin Pao, NP

## 2023-08-08 NOTE — Telephone Encounter (Signed)
 Reordered 08/07/23 by Medora Baseman RN and was sent to requesting pharmacy   Requested Prescriptions  Refused Prescriptions Disp Refills   lisinopril -hydrochlorothiazide  (ZESTORETIC ) 20-25 MG tablet [Pharmacy Med Name: LISINOPRIL -HCTZ 20/25MG  TABLETS] 90 tablet 0    Sig: TAKE 1 TABLET BY MOUTH DAILY     Cardiovascular:  ACEI + Diuretic Combos Failed - 08/08/2023 10:59 AM      Failed - Last BP in normal range    BP Readings from Last 1 Encounters:  07/04/23 (!) (P) 144/94         Passed - Na in normal range and within 180 days    Sodium  Date Value Ref Range Status  04/03/2023 135 134 - 144 mmol/L Final         Passed - K in normal range and within 180 days    Potassium  Date Value Ref Range Status  04/03/2023 4.3 3.5 - 5.2 mmol/L Final         Passed - Cr in normal range and within 180 days    Creat  Date Value Ref Range Status  02/19/2020 1.10 0.70 - 1.33 mg/dL Final    Comment:    For patients >55 years of age, the reference limit for Creatinine is approximately 13% higher for people identified as African-American. .    Creatinine, Ser  Date Value Ref Range Status  04/03/2023 0.92 0.76 - 1.27 mg/dL Final         Passed - eGFR is 30 or above and within 180 days    GFR, Est African American  Date Value Ref Range Status  02/19/2020 90 > OR = 60 mL/min/1.48m2 Final   GFR, Est Non African American  Date Value Ref Range Status  02/19/2020 77 > OR = 60 mL/min/1.9m2 Final   eGFR  Date Value Ref Range Status  04/03/2023 99 >59 mL/min/1.73 Final         Passed - Patient is not pregnant      Passed - Valid encounter within last 6 months    Recent Outpatient Visits           4 months ago Encounter for examination required by Department of Transportation (DOT)   [facility] [place], [place] T, NP   5 months ago Spondylosis without myelopathy or radiculopathy, lumbar region   [facility] Melvin Pao, NP

## 2023-10-15 ENCOUNTER — Other Ambulatory Visit: Payer: Self-pay | Admitting: Nurse Practitioner

## 2023-10-17 NOTE — Telephone Encounter (Signed)
 Requested Prescriptions  Pending Prescriptions Disp Refills   meloxicam  (MOBIC ) 15 MG tablet [Pharmacy Med Name: MELOXICAM  15MG  TABLETS] 90 tablet 0    Sig: TAKE 1 TABLET(15 MG) BY MOUTH DAILY     Analgesics:  COX2 Inhibitors Failed - 10/17/2023  9:26 AM      Failed - Manual Review: Labs are only required if the patient has taken medication for more than 8 weeks.      Failed - HGB in normal range and within 360 days    Hemoglobin  Date Value Ref Range Status  10/05/2021 15.2 13.0 - 17.7 g/dL Final         Failed - HCT in normal range and within 360 days    Hematocrit  Date Value Ref Range Status  10/05/2021 44.4 37.5 - 51.0 % Final         Passed - Cr in normal range and within 360 days    Creat  Date Value Ref Range Status  02/19/2020 1.10 0.70 - 1.33 mg/dL Final    Comment:    For patients >53 years of age, the reference limit for Creatinine is approximately 13% higher for people identified as African-American. .    Creatinine, Ser  Date Value Ref Range Status  04/03/2023 0.92 0.76 - 1.27 mg/dL Final         Passed - AST in normal range and within 360 days    AST  Date Value Ref Range Status  04/03/2023 24 0 - 40 IU/L Final         Passed - ALT in normal range and within 360 days    ALT  Date Value Ref Range Status  11/08/2022 29 0 - 44 IU/L Final         Passed - eGFR is 30 or above and within 360 days    GFR, Est African American  Date Value Ref Range Status  02/19/2020 90 > OR = 60 mL/min/1.34m2 Final   GFR, Est Non African American  Date Value Ref Range Status  02/19/2020 77 > OR = 60 mL/min/1.37m2 Final   eGFR  Date Value Ref Range Status  04/03/2023 99 >59 mL/min/1.73 Final         Passed - Patient is not pregnant      Passed - Valid encounter within last 12 months    Recent Outpatient Visits           7 months ago Encounter for examination required by Department of Transportation (DOT)   [place] [facility] [place], [place]  T, NP   7 months ago Spondylosis without myelopathy or radiculopathy, lumbar region   [facility] Melvin Pao, NP

## 2023-11-03 ENCOUNTER — Other Ambulatory Visit: Payer: Self-pay | Admitting: Nurse Practitioner

## 2023-11-04 NOTE — Telephone Encounter (Signed)
 Requested Prescriptions  Pending Prescriptions Disp Refills   lisinopril -hydrochlorothiazide  (ZESTORETIC ) 20-25 MG tablet [Pharmacy Med Name: LISINOPRIL -HCTZ 20/25MG  TABLETS] 90 tablet 0    Sig: TAKE 1 TABLET BY MOUTH DAILY     Cardiovascular:  ACEI + Diuretic Combos Failed - 11/04/2023 10:34 AM      Failed - Na in normal range and within 180 days    Sodium  Date Value Ref Range Status  04/03/2023 135 134 - 144 mmol/L Final         Failed - K in normal range and within 180 days    Potassium  Date Value Ref Range Status  04/03/2023 4.3 3.5 - 5.2 mmol/L Final         Failed - Cr in normal range and within 180 days    Creat  Date Value Ref Range Status  02/19/2020 1.10 0.70 - 1.33 mg/dL Final    Comment:    For patients >44 years of age, the reference limit for Creatinine is approximately 13% higher for people identified as African-American. .    Creatinine, Ser  Date Value Ref Range Status  04/03/2023 0.92 0.76 - 1.27 mg/dL Final         Failed - eGFR is 30 or above and within 180 days    GFR, Est African American  Date Value Ref Range Status  02/19/2020 90 > OR = 60 mL/min/1.25m2 Final   GFR, Est Non African American  Date Value Ref Range Status  02/19/2020 77 > OR = 60 mL/min/1.91m2 Final   eGFR  Date Value Ref Range Status  04/03/2023 99 >59 mL/min/1.73 Final         Failed - Last BP in normal range    BP Readings from Last 1 Encounters:  07/04/23 (!) (P) 144/94         Failed - Valid encounter within last 6 months    Recent Outpatient Visits           7 months ago Encounter for examination required by Department of Transportation (DOT)   [place] [facility] [place], Melanie T, NP   8 months ago Spondylosis without myelopathy or radiculopathy, lumbar region   [facility] Melvin Pao, NP              Passed - Patient is not pregnant

## 2024-01-16 ENCOUNTER — Other Ambulatory Visit: Payer: Self-pay | Admitting: Nurse Practitioner

## 2024-01-20 NOTE — Patient Instructions (Signed)

## 2024-01-23 ENCOUNTER — Other Ambulatory Visit: Payer: Self-pay | Admitting: Nurse Practitioner

## 2024-01-24 ENCOUNTER — Encounter: Payer: Self-pay | Admitting: Nurse Practitioner

## 2024-01-24 ENCOUNTER — Ambulatory Visit: Payer: Self-pay | Admitting: Nurse Practitioner

## 2024-01-24 ENCOUNTER — Other Ambulatory Visit: Payer: Self-pay | Admitting: Nurse Practitioner

## 2024-01-24 VITALS — BP 140/82 | Temp 97.5°F | Resp 17 | Ht 70.98 in | Wt 270.0 lb

## 2024-01-24 DIAGNOSIS — M545 Low back pain, unspecified: Secondary | ICD-10-CM

## 2024-01-24 DIAGNOSIS — E559 Vitamin D deficiency, unspecified: Secondary | ICD-10-CM | POA: Diagnosis not present

## 2024-01-24 DIAGNOSIS — E782 Mixed hyperlipidemia: Secondary | ICD-10-CM

## 2024-01-24 DIAGNOSIS — I10 Essential (primary) hypertension: Secondary | ICD-10-CM | POA: Diagnosis not present

## 2024-01-24 LAB — MICROALBUMIN, URINE WAIVED
Creatinine, Urine Waived: 100 mg/dL (ref 10–300)
Microalb, Ur Waived: 10 mg/L (ref 0–19)
Microalb/Creat Ratio: <30 mg/g

## 2024-01-24 MED ORDER — LISINOPRIL 20 MG PO TABS: 20.0000 mg | ORAL_TABLET | Freq: Every day | ORAL | 3 refills | Status: AC

## 2024-01-24 MED ORDER — MELOXICAM 15 MG PO TABS: 15.0000 mg | ORAL_TABLET | Freq: Every day | ORAL | 1 refills | Status: AC | PRN

## 2024-01-24 MED ORDER — LISINOPRIL-HYDROCHLOROTHIAZIDE 20-25 MG PO TABS: 1.0000 | ORAL_TABLET | Freq: Every day | ORAL | 3 refills | Status: AC

## 2024-01-24 NOTE — Assessment & Plan Note (Signed)
 Chronic, ongoing. Showed him ASCVD score and guideline chart last visit.  Will recheck today and if elevations consider starting low dose Rosuvastatin 10 MG, then adjust as needed as current ASCVD above goal.  Discussed at length with him.

## 2024-01-24 NOTE — Assessment & Plan Note (Signed)
 Chronic with BP above goal on initial and recheck today. Will increase Lisinopril  dose to 40 MG total daily, discussed with him this plan and that will send in 20 MG tablet to take with current combo pill so he can attain full 40 MG daily. Recommend she monitor BP at least a few mornings a week at home and document.  DASH diet at home.  Continue current medication regimen and adjust as needed.  Labs today: CBC, CMP, TSH, urine ALB.  Return in 4 weeks.

## 2024-01-24 NOTE — Assessment & Plan Note (Signed)
Chronic, ongoing.  Recheck level today.  Continue supplement and adjust as needed.

## 2024-01-24 NOTE — Assessment & Plan Note (Signed)
 Chronic, ongoing. Will refill Meloxicam  but recommend he try to take as needed daily only. Discussed possible side effects with this, including elevation in BP or GI bleeds.

## 2024-01-24 NOTE — Progress Notes (Signed)
 "  BP (!) 140/82 (BP Location: Left Arm, Patient Position: Sitting, Cuff Size: Large)   Temp (!) 97.5 F (36.4 C) (Oral)   Resp 17   Ht 5' 10.98 (1.803 m)   Wt 270 lb (122.5 kg)   SpO2 94%   BMI 37.67 kg/m    Subjective:    Patient ID: Paul [name], male    DOB: 07-08-1970, 55 y.o.   MRN: [id number]  HPI: Paul [name] is a 54 y.o. male  Chief Complaint  Patient presents with   Follow-up    Here for follow up has not been in a while.   Medication Refill    Needs mobic  refill   HYPERTENSION / HYPERLIPIDEMIA Taking Lisinopril -HCTZ, forgot it yesterday.  No current statin therapy. Takes Meloxicam  daily for back discomfort, has tried not to take but while at work pain returns. Continues to take Vitamin D  daily for history of lows. Satisfied with current treatment? yes Duration of hypertension: chronic BP monitoring frequency: has not for a few months BP range:  BP medication side effects: no Duration of hyperlipidemia: chronic Aspirin: no Recent stressors: no Recurrent headaches: no Visual changes: no Palpitations: no Dyspnea: no Chest pain: no Lower extremity edema: no Dizzy/lightheaded: no  The 10-year ASCVD risk score (Arnett DK, et al., 2019) is: 7.7%   Values used to calculate the score:     Age: 89 years     Clinically relevant sex: Male     Is Non-Hispanic African American: No     Diabetic: No     Tobacco smoker: No     Systolic Blood Pressure: 140 mmHg     Is BP treated: Yes     HDL Cholesterol: 41 mg/dL     Total Cholesterol: 174 mg/dL   Relevant past medical, surgical, family and social history reviewed and updated as indicated. Interim medical history since our last visit reviewed. Allergies and medications reviewed and updated.  Review of Systems  Constitutional:  Negative for activity change, diaphoresis, fatigue and fever.  Respiratory:  Negative for cough, chest tightness, shortness of breath and wheezing.   Cardiovascular:  Negative for chest  pain, palpitations and leg swelling.  Gastrointestinal: Negative.   Endocrine: Negative for polydipsia, polyphagia and polyuria.  Neurological: Negative.   Psychiatric/Behavioral: Negative.      Per HPI unless specifically indicated above     Objective:    BP (!) 140/82 (BP Location: Left Arm, Patient Position: Sitting, Cuff Size: Large)   Temp (!) 97.5 F (36.4 C) (Oral)   Resp 17   Ht 5' 10.98 (1.803 m)   Wt 270 lb (122.5 kg)   SpO2 94%   BMI 37.67 kg/m   Wt Readings from Last 3 Encounters:  01/24/24 270 lb (122.5 kg)  07/04/23 278 lb (126.1 kg)  04/17/23 281 lb (127.5 kg)    Physical Exam Vitals and nursing note reviewed.  Constitutional:      General: He is awake. He is not in acute distress.    Appearance: He is well-developed and well-groomed. He is obese. He is not ill-appearing or toxic-appearing.  HENT:     Head: Normocephalic.     Right Ear: Hearing and external ear normal.     Left Ear: Hearing and external ear normal.  Eyes:     General: Lids are normal.     Extraocular Movements: Extraocular movements intact.     Conjunctiva/sclera: Conjunctivae normal.  Neck:     Thyroid : No thyromegaly.  Vascular: No carotid bruit.  Cardiovascular:     Rate and Rhythm: Normal rate and regular rhythm.     Heart sounds: Normal heart sounds.  Pulmonary:     Effort: No accessory muscle usage or respiratory distress.     Breath sounds: Normal breath sounds. No decreased breath sounds, wheezing or rales.  Abdominal:     General: Bowel sounds are normal. There is no distension.     Palpations: Abdomen is soft.     Tenderness: There is no abdominal tenderness.  Musculoskeletal:     Cervical back: Full passive range of motion without pain.     Right lower leg: No edema.     Left lower leg: No edema.  Lymphadenopathy:     Cervical: No cervical adenopathy.  Skin:    General: Skin is warm.     Capillary Refill: Capillary refill takes less than 2 seconds.   Neurological:     Mental Status: He is alert and oriented to person, place, and time.     Deep Tendon Reflexes: Reflexes are normal and symmetric.     Reflex Scores:      Brachioradialis reflexes are 2+ on the right side and 2+ on the left side.      Patellar reflexes are 2+ on the right side and 2+ on the left side. Psychiatric:        Attention and Perception: Attention normal.        Mood and Affect: Mood normal.        Speech: Speech normal.        Behavior: Behavior normal. Behavior is cooperative.        Thought Content: Thought content normal.    Results for orders placed or performed in visit on 04/03/23  Compliance Drug Analysis, Ur   Collection Time: 04/03/23  9:03 AM  Result Value Ref Range   Summary FINAL   Comp. Metabolic Panel (12)   Collection Time: 04/03/23  9:03 AM  Result Value Ref Range   Glucose 106 (H) 70 - 99 mg/dL   BUN 17 6 - 24 mg/dL   Creatinine, Ser 9.07 0.76 - 1.27 mg/dL   eGFR 99 >40 fO/fpw/8.26   BUN/Creatinine Ratio 18 9 - 20   Sodium 135 134 - 144 mmol/L   Potassium 4.3 3.5 - 5.2 mmol/L   Chloride 99 96 - 106 mmol/L   Calcium 9.2 8.7 - 10.2 mg/dL   Total Protein 7.1 6.0 - 8.5 g/dL   Albumin 4.6 3.8 - 4.9 g/dL   Globulin, Total 2.5 1.5 - 4.5 g/dL   Bilirubin Total 0.6 0.0 - 1.2 mg/dL   Alkaline Phosphatase 86 44 - 121 IU/L   AST 24 0 - 40 IU/L  Magnesium   Collection Time: 04/03/23  9:03 AM  Result Value Ref Range   Magnesium 2.1 1.6 - 2.3 mg/dL  Vitamin B12   Collection Time: 04/03/23  9:03 AM  Result Value Ref Range   Vitamin B-12 374 232 - 1,245 pg/mL  Sedimentation rate   Collection Time: 04/03/23  9:03 AM  Result Value Ref Range   Sed Rate 7 0 - 30 mm/hr  25-Hydroxy vitamin D  Lcms D2+D3   Collection Time: 04/03/23  9:03 AM  Result Value Ref Range   25-Hydroxy, Vitamin D  19 (L) ng/mL   25-Hydroxy, Vitamin D -2 <1.0 ng/mL   25-Hydroxy, Vitamin D -3 19 ng/mL  C-reactive protein   Collection Time: 04/03/23  9:03 AM  Result Value  Ref Range  CRP 1 0 - 10 mg/L      Assessment & Plan:   Problem List Items Addressed This Visit       Cardiovascular and Mediastinum   Essential hypertension, benign - Primary   Chronic with BP above goal on initial and recheck today. Will increase Lisinopril  dose to 40 MG total daily, discussed with him this plan and that will send in 20 MG tablet to take with current combo pill so he can attain full 40 MG daily. Recommend she monitor BP at least a few mornings a week at home and document.  DASH diet at home.  Continue current medication regimen and adjust as needed.  Labs today: CBC, CMP, TSH, urine ALB.  Return in 4 weeks.       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 Will refill Meloxicam  but recommend he try to take as needed daily only. Discussed possible side effects with this, including elevation in BP or GI bleeds.      Relevant Medications   meloxicam  (MOBIC ) 15 MG tablet   Vitamin D  deficiency   Chronic, ongoing.  Recheck level today.  Continue supplement and adjust as needed.      Relevant Orders   VITAMIN D  25 Hydroxy (Vit-D Deficiency, Fractures)   Hyperlipidemia   Chronic, ongoing. Showed him ASCVD score and guideline chart last visit.  Will recheck today and if elevations consider starting low dose Rosuvastatin 10 MG, then adjust as needed as current ASCVD above goal.  Discussed at length with him.         Relevant Medications   lisinopril -hydrochlorothiazide  (ZESTORETIC ) 20-25 MG tablet   lisinopril  (ZESTRIL ) 20 MG tablet   Other Relevant Orders   Comprehensive metabolic panel with GFR   Lipid Panel w/o Chol/HDL Ratio      Follow up plan: Return in about 4 weeks (around 02/21/2024) for HTN -- increased  Lisinopril  dose to 40 MG daily on 01/24/24.      "

## 2024-01-25 ENCOUNTER — Ambulatory Visit: Payer: Self-pay | Admitting: Nurse Practitioner

## 2024-01-25 LAB — CBC WITH DIFFERENTIAL/PLATELET
Basophils Absolute: 0 x10E3/uL (ref 0.0–0.2)
Basos: 1 %
EOS (ABSOLUTE): 0.1 x10E3/uL (ref 0.0–0.4)
Eos: 3 %
Hematocrit: 43.7 % (ref 37.5–51.0)
Hemoglobin: 14.3 g/dL (ref 13.0–17.7)
Immature Grans (Abs): 0 x10E3/uL (ref 0.0–0.1)
Immature Granulocytes: 0 %
Lymphocytes Absolute: 1.2 x10E3/uL (ref 0.7–3.1)
Lymphs: 32 %
MCH: 29.3 pg (ref 26.6–33.0)
MCHC: 32.7 g/dL (ref 31.5–35.7)
MCV: 90 fL (ref 79–97)
Monocytes Absolute: 0.4 x10E3/uL (ref 0.1–0.9)
Monocytes: 11 %
Neutrophils Absolute: 2 x10E3/uL (ref 1.4–7.0)
Neutrophils: 53 %
Platelets: 182 x10E3/uL (ref 150–450)
RBC: 4.88 x10E6/uL (ref 4.14–5.80)
RDW: 12.1 % (ref 11.6–15.4)
WBC: 3.8 x10E3/uL (ref 3.4–10.8)

## 2024-01-25 LAB — COMPREHENSIVE METABOLIC PANEL WITH GFR
ALT: 34 IU/L (ref 0–44)
AST: 22 IU/L (ref 0–40)
Albumin: 4.4 g/dL (ref 3.8–4.9)
Alkaline Phosphatase: 76 IU/L (ref 47–123)
BUN/Creatinine Ratio: 16 (ref 9–20)
BUN: 15 mg/dL (ref 6–24)
Bilirubin Total: 0.6 mg/dL (ref 0.0–1.2)
CO2: 24 mmol/L (ref 20–29)
Calcium: 9.2 mg/dL (ref 8.7–10.2)
Chloride: 101 mmol/L (ref 96–106)
Creatinine, Ser: 0.92 mg/dL (ref 0.76–1.27)
Globulin, Total: 2.7 g/dL (ref 1.5–4.5)
Glucose: 86 mg/dL (ref 70–99)
Potassium: 4.4 mmol/L (ref 3.5–5.2)
Sodium: 141 mmol/L (ref 134–144)
Total Protein: 7.1 g/dL (ref 6.0–8.5)
eGFR: 98 mL/min/1.73

## 2024-01-25 LAB — LIPID PANEL W/O CHOL/HDL RATIO
Cholesterol, Total: 149 mg/dL (ref 100–199)
HDL: 43 mg/dL
LDL Chol Calc (NIH): 87 mg/dL (ref 0–99)
Triglycerides: 102 mg/dL (ref 0–149)
VLDL Cholesterol Cal: 19 mg/dL (ref 5–40)

## 2024-01-25 LAB — TSH: TSH: 1.3 u[IU]/mL (ref 0.450–4.500)

## 2024-01-25 LAB — VITAMIN D 25 HYDROXY (VIT D DEFICIENCY, FRACTURES): Vit D, 25-Hydroxy: 30.1 ng/mL (ref 30.0–100.0)

## 2024-01-25 NOTE — Telephone Encounter (Signed)
 Requested Prescriptions  Refused Prescriptions Disp Refills   meloxicam  (MOBIC ) 15 MG tablet [Pharmacy Med Name: MELOXICAM  15MG  TABLETS] 90 tablet 0    Sig: TAKE 1 TABLET(15 MG) BY MOUTH DAILY     Analgesics:  COX2 Inhibitors Failed - 01/25/2024  7:41 AM      Failed - Manual Review: Labs are only required if the patient has taken medication for more than 8 weeks.      Passed - HGB in normal range and within 360 days    Hemoglobin  Date Value Ref Range Status  01/24/2024 14.3 13.0 - 17.7 g/dL Final         Passed - Cr in normal range and within 360 days    Creat  Date Value Ref Range Status  02/19/2020 1.10 0.70 - 1.33 mg/dL Final    Comment:    For patients >47 years of age, the reference limit for Creatinine is approximately 13% higher for people identified as African-American. .    Creatinine, Ser  Date Value Ref Range Status  01/24/2024 0.92 0.76 - 1.27 mg/dL Final         Passed - HCT in normal range and within 360 days    Hematocrit  Date Value Ref Range Status  01/24/2024 43.7 37.5 - 51.0 % Final         Passed - AST in normal range and within 360 days    AST  Date Value Ref Range Status  01/24/2024 22 0 - 40 IU/L Final         Passed - ALT in normal range and within 360 days    ALT  Date Value Ref Range Status  01/24/2024 34 0 - 44 IU/L Final         Passed - eGFR is 30 or above and within 360 days    GFR, Est African American  Date Value Ref Range Status  02/19/2020 90 > OR = 60 mL/min/1.38m2 Final   GFR, Est Non African American  Date Value Ref Range Status  02/19/2020 77 > OR = 60 mL/min/1.2m2 Final   eGFR  Date Value Ref Range Status  01/24/2024 98 >59 mL/min/1.73 Final         Passed - Patient is not pregnant      Passed - Valid encounter within last 12 months    Recent Outpatient Visits           Yesterday Essential hypertension, benign   [facility] [place], [place] T, NP   10 months ago Encounter for  examination required by Department of Transportation (DOT)   [facility] [place], [place] T, NP   10 months ago Spondylosis without myelopathy or radiculopathy, lumbar region   [facility] [place]o, NP

## 2024-01-25 NOTE — Progress Notes (Signed)
 Contacted via MyChart The 10-year ASCVD risk score (Arnett DK, et al., 2019) is: 6.2%   Values used to calculate the score:     Age: 54 years     Clinically relevant sex: Male     Is Non-Hispanic African American: No     Diabetic: No     Tobacco smoker: No     Systolic Blood Pressure: 140 mmHg     Is BP treated: Yes     HDL Cholesterol: 43 mg/dL     Total Cholesterol: 149 mg/dL  Good morning Paul [name], your labs have returned and overall these look great. Your LDL, bad cholesterol, has trended down and we do not need to start statin therapy yet, but focus heavily on healthy diet and regular exercise. Any questions? Keep being stellar!!  Thank you for allowing me to participate in your care.  I appreciate you. Kindest regards, Orville Widmann

## 2024-02-23 ENCOUNTER — Ambulatory Visit: Admitting: Nurse Practitioner
